# Patient Record
Sex: Female | Born: 1985 | Race: Black or African American | Hispanic: No | Marital: Married | Smoking: Never smoker
Health system: Southern US, Community
[De-identification: ages and names within clinical notes are randomized; demographics above are authoritative.]

## PROBLEM LIST (undated history)

## (undated) DIAGNOSIS — Z789 Other specified health status: Secondary | ICD-10-CM

## (undated) HISTORY — PX: NO PAST SURGERIES: SHX2092

---

## 2015-06-07 NOTE — L&D Delivery Note (Signed)
Delivery Note At 11:36 PM a viable female was delivered via Vaginal, Spontaneous Delivery (Presentation: LOA ).  APGAR: 7, 9; weight: pending.  Cord clamped and cut by CNM; hospital cord blood sample collected. Placenta status: spont , intact .  Cord:  3 vessel  Anesthesia:  None Episiotomy: None Lacerations: None Est. Blood Loss (mL):  100  Mom to postpartum.  Baby to Couplet care / Skin to Skin.  Cam HaiSHAW, Arien Morine CNM 02/18/2016, 12:03 AM

## 2015-07-23 LAB — OB RESULTS CONSOLE HEPATITIS B SURFACE ANTIGEN: Hepatitis B Surface Ag: NEGATIVE

## 2015-07-23 LAB — OB RESULTS CONSOLE VARICELLA ZOSTER ANTIBODY, IGG: Varicella: IMMUNE

## 2015-07-23 LAB — OB RESULTS CONSOLE HIV ANTIBODY (ROUTINE TESTING): HIV: NONREACTIVE

## 2015-10-15 LAB — OB RESULTS CONSOLE HGB/HCT, BLOOD
HEMATOCRIT: 35 %
Hemoglobin: 11.5 g/dL

## 2015-10-15 LAB — OB RESULTS CONSOLE RPR
RPR: NONREACTIVE
RPR: NONREACTIVE

## 2015-10-15 LAB — OB RESULTS CONSOLE GC/CHLAMYDIA
CHLAMYDIA, DNA PROBE: NEGATIVE
Chlamydia: NEGATIVE
GC PROBE AMP, GENITAL: NEGATIVE
Gonorrhea: NEGATIVE

## 2015-10-15 LAB — OB RESULTS CONSOLE ABO/RH: RH TYPE: POSITIVE

## 2015-10-15 LAB — OB RESULTS CONSOLE ANTIBODY SCREEN: ANTIBODY SCREEN: NEGATIVE

## 2015-10-15 LAB — OB RESULTS CONSOLE HIV ANTIBODY (ROUTINE TESTING)
HIV: NONREACTIVE
HIV: NONREACTIVE

## 2015-10-15 LAB — OB RESULTS CONSOLE PLATELET COUNT: PLATELETS: 143 10*3/uL

## 2015-10-16 ENCOUNTER — Other Ambulatory Visit (HOSPITAL_COMMUNITY): Payer: Self-pay | Admitting: Nurse Practitioner

## 2015-10-16 DIAGNOSIS — Z3689 Encounter for other specified antenatal screening: Secondary | ICD-10-CM

## 2015-10-23 ENCOUNTER — Encounter (HOSPITAL_COMMUNITY): Payer: Self-pay | Admitting: Nurse Practitioner

## 2015-10-28 ENCOUNTER — Encounter (HOSPITAL_COMMUNITY): Payer: Self-pay

## 2015-10-29 ENCOUNTER — Ambulatory Visit (HOSPITAL_COMMUNITY): Payer: Self-pay

## 2015-10-29 ENCOUNTER — Other Ambulatory Visit (HOSPITAL_COMMUNITY): Payer: Self-pay | Admitting: Nurse Practitioner

## 2015-10-29 ENCOUNTER — Encounter (HOSPITAL_COMMUNITY): Payer: Self-pay

## 2015-10-29 ENCOUNTER — Ambulatory Visit (HOSPITAL_COMMUNITY)
Admission: RE | Admit: 2015-10-29 | Discharge: 2015-10-29 | Disposition: A | Discharge status: Home / Self Care | Payer: Medicaid Other | Source: Ambulatory Visit | Attending: Nurse Practitioner | Admitting: Nurse Practitioner

## 2015-10-29 VITALS — BP 122/77 | HR 84 | Wt 143.2 lb

## 2015-10-29 DIAGNOSIS — O09292 Supervision of pregnancy with other poor reproductive or obstetric history, second trimester: Secondary | ICD-10-CM | POA: Insufficient documentation

## 2015-10-29 DIAGNOSIS — O09299 Supervision of pregnancy with other poor reproductive or obstetric history, unspecified trimester: Secondary | ICD-10-CM

## 2015-10-29 DIAGNOSIS — Z36 Encounter for antenatal screening of mother: Secondary | ICD-10-CM | POA: Diagnosis present

## 2015-10-29 DIAGNOSIS — Z3689 Encounter for other specified antenatal screening: Secondary | ICD-10-CM

## 2015-10-29 DIAGNOSIS — Z3A24 24 weeks gestation of pregnancy: Secondary | ICD-10-CM

## 2015-10-29 HISTORY — DX: Other specified health status: Z78.9

## 2015-10-30 ENCOUNTER — Other Ambulatory Visit (HOSPITAL_COMMUNITY): Payer: Self-pay

## 2015-11-05 ENCOUNTER — Ambulatory Visit (INDEPENDENT_AMBULATORY_CARE_PROVIDER_SITE_OTHER): Payer: Medicaid Other | Admitting: Obstetrics & Gynecology

## 2015-11-05 ENCOUNTER — Encounter: Payer: Self-pay | Admitting: Obstetrics & Gynecology

## 2015-11-05 VITALS — BP 114/7 | HR 84 | Wt 142.8 lb

## 2015-11-05 DIAGNOSIS — O09299 Supervision of pregnancy with other poor reproductive or obstetric history, unspecified trimester: Secondary | ICD-10-CM | POA: Insufficient documentation

## 2015-11-05 DIAGNOSIS — Z3482 Encounter for supervision of other normal pregnancy, second trimester: Secondary | ICD-10-CM

## 2015-11-05 DIAGNOSIS — O09292 Supervision of pregnancy with other poor reproductive or obstetric history, second trimester: Secondary | ICD-10-CM

## 2015-11-05 DIAGNOSIS — Z3490 Encounter for supervision of normal pregnancy, unspecified, unspecified trimester: Secondary | ICD-10-CM | POA: Insufficient documentation

## 2015-11-05 LAB — POCT URINALYSIS DIP (DEVICE)
BILIRUBIN URINE: NEGATIVE
Glucose, UA: NEGATIVE mg/dL
HGB URINE DIPSTICK: NEGATIVE
KETONES UR: NEGATIVE mg/dL
LEUKOCYTES UA: NEGATIVE
Nitrite: NEGATIVE
PH: 7 (ref 5.0–8.0)
Protein, ur: NEGATIVE mg/dL
Specific Gravity, Urine: 1.015 (ref 1.005–1.030)
Urobilinogen, UA: 0.2 mg/dL (ref 0.0–1.0)

## 2015-11-05 NOTE — Progress Notes (Signed)
   Subjective: referred for h/o SGA infants delivered in [place]    Tiffany Osborn is a [id number] 5823w3d being seen today for her first obstetrical visit.  Her obstetrical history is significant for h/o SGA infants. Patient does intend to breast feed. Pregnancy history fully reviewed.  Patient reports no complaints.  Filed Vitals:   11/05/15 0859  BP: 114/7  Pulse: 84  Weight: 142 lb 12.8 oz (64.774 kg)    HISTORY: OB History  Gravida Para Term Preterm AB SAB TAB Ectopic Multiple Living  7 6 6  0 0 0 0 0 1 7    # Outcome Date GA Lbr Len/2nd Weight Sex Delivery Anes PTL Lv  7 Current           6A Term 2015    M Vag-Spont   Y  6B Term 2015    M Vag-Spont   Y  5 Term 2014    F Vag-Spont   Y  4 Term 2012    F Vag-Spont   Y  3 Term 2009    F Vag-Spont   Y  2 Term 2007    M Vag-Spont   Y  1 Term 2004    M Vag-Spont   Y     Past Medical History  Diagnosis Date  . Medical history non-contributory    Past Surgical History  Procedure Laterality Date  . No past surgeries     History reviewed. No pertinent family history.   Exam    Uterus:  Fundal Height: 26 cm  Pelvic Exam:                               System: Breast:      Skin: normal coloration and turgor, no rashes    Neurologic: oriented, normal mood   Extremities: normal strength, tone, and muscle mass   HEENT PERRLA   Mouth/Teeth dental hygiene good   Neck supple   Cardiovascular: regular rate and rhythm   Respiratory:  appears well, vitals normal, no respiratory distress, acyanotic, normal RR   Abdomen: gravid c/w dates   Urinary:        Assessment:    Pregnancy: [id number] Patient Active Problem List   Diagnosis Date Noted  . Supervision of normal pregnancy, antepartum 11/05/2015  . Prior pregnancy complicated by SGA (small for gestational age), antepartum 11/05/2015        Plan:     Initial labs drawn. Prenatal vitamins. Problem list reviewed and updated. Genetic Screening discussed not done  late to care  Ultrasound discussed; fetal survey: results reviewed.  Follow up in 3 weeks. 50% of 30 min visit spent on counseling and coordination of care.  Discussed history, has had normal and low birth weight baby, last delivery was normal weight twins. Plan f/u US 32 weeks   Tiffany Osborn 11/05/2015

## 2015-11-05 NOTE — Patient Instructions (Signed)

## 2015-11-05 NOTE — Progress Notes (Signed)
Swahili interpreter [phone]

## 2015-11-06 ENCOUNTER — Encounter: Payer: Self-pay | Admitting: *Deleted

## 2015-11-06 DIAGNOSIS — Z3480 Encounter for supervision of other normal pregnancy, unspecified trimester: Secondary | ICD-10-CM

## 2015-11-06 LAB — CYTOLOGY - PAP: Pap: NEGATIVE

## 2015-11-26 ENCOUNTER — Ambulatory Visit (INDEPENDENT_AMBULATORY_CARE_PROVIDER_SITE_OTHER): Payer: Medicaid Other | Admitting: Family Medicine

## 2015-11-26 VITALS — BP 122/64 | HR 79 | Wt 146.8 lb

## 2015-11-26 DIAGNOSIS — Z3483 Encounter for supervision of other normal pregnancy, third trimester: Secondary | ICD-10-CM

## 2015-11-26 DIAGNOSIS — O09293 Supervision of pregnancy with other poor reproductive or obstetric history, third trimester: Secondary | ICD-10-CM

## 2015-11-26 DIAGNOSIS — Z23 Encounter for immunization: Secondary | ICD-10-CM

## 2015-11-26 DIAGNOSIS — O365930 Maternal care for other known or suspected poor fetal growth, third trimester, not applicable or unspecified: Secondary | ICD-10-CM

## 2015-11-26 LAB — POCT URINALYSIS DIP (DEVICE)
BILIRUBIN URINE: NEGATIVE
GLUCOSE, UA: NEGATIVE mg/dL
HGB URINE DIPSTICK: NEGATIVE
Ketones, ur: NEGATIVE mg/dL
LEUKOCYTES UA: NEGATIVE
NITRITE: NEGATIVE
Protein, ur: NEGATIVE mg/dL
Specific Gravity, Urine: 1.02 (ref 1.005–1.030)
UROBILINOGEN UA: 0.2 mg/dL (ref 0.0–1.0)
pH: 7 (ref 5.0–8.0)

## 2015-11-26 LAB — CBC
HCT: 33.8 % — ABNORMAL LOW (ref 35.0–45.0)
HEMOGLOBIN: 11.2 g/dL — AB (ref 11.7–15.5)
MCH: 27.9 pg (ref 27.0–33.0)
MCHC: 33.1 g/dL (ref 32.0–36.0)
MCV: 84.3 fL (ref 80.0–100.0)
MPV: 11.3 fL (ref 7.5–12.5)
Platelets: 145 10*3/uL (ref 140–400)
RBC: 4.01 MIL/uL (ref 3.80–5.10)
RDW: 13.1 % (ref 11.0–15.0)
WBC: 4.8 10*3/uL (ref 3.8–10.8)

## 2015-11-26 MED ORDER — TETANUS-DIPHTH-ACELL PERTUSSIS 5-2.5-18.5 LF-MCG/0.5 IM SUSP
0.5000 mL | Freq: Once | INTRAMUSCULAR | Status: AC
  Administered 2015-11-26: 0.5 mL via INTRAMUSCULAR

## 2015-11-26 NOTE — Progress Notes (Signed)
Subjective:  Tiffany Osborn is a 30 y.o. G7P6007 at [redacted]w[redacted]d being seen today for ongoing prenatal care.  She is currently monitored for the following issues for this low-risk pregnancy and has Supervision of normal pregnancy, antepartum and Prior pregnancy complicated by SGA (small for gestational age), antepartum on her problem list.  Patient reports no complaints.  Contractions: Not present. Vag. Bleeding: None.  Movement: Present. Denies leaking of fluid.   The following portions of the patient's history were reviewed and updated as appropriate: allergies, current medications, past family history, past medical history, past social history, past surgical history and problem list. Problem list updated.  Objective:   Filed Vitals:   11/26/15 1055  BP: 122/64  Pulse: 79  Weight: 146 lb 12.8 oz (66.588 kg)    Fetal Status: Fetal Heart Rate (bpm): 155 Fundal Height: 28 cm Movement: Present     General:  Alert, oriented and cooperative. Patient is in no acute distress.  Skin: Skin is warm and dry. No rash noted.   Cardiovascular: Normal heart rate noted  Respiratory: Normal respiratory effort, no problems with respiration noted  Abdomen: Soft, gravid, appropriate for gestational age. Pain/Pressure: Absent     Pelvic: Cervical exam deferred        Extremities: Normal range of motion.  Edema: None  Mental Status: Normal mood and affect. Normal behavior. Normal judgment and thought content.   Urinalysis: Urine Protein: Negative Urine Glucose: Negative  Assessment and Plan:  Pregnancy: G7P6007 at 320w3d  1. Prior pregnancy complicated by SGA (small for gestational age), antepartum, third trimester   2. Supervision of normal pregnancy, antepartum, third trimester - updated box - Tdap (BOOSTRIX) injection 0.5 mL; Inject 0.5 mLs into the muscle once. - CBC - HIV antibody (with reflex) - RPR - Glucose Tolerance, 1 HR (50g) w/o Fasting  Preterm labor symptoms and general obstetric precautions  including but not limited to vaginal bleeding, contractions, leaking of fluid and fetal movement were reviewed in detail with the patient. Please refer to After Visit Summary for other counseling recommendations.  Return in about 4 weeks (around 12/24/2015) for Routine prenatal care.   Federico FlakeKimberly Niles Saharah Sherrow, MD

## 2015-11-26 NOTE — Patient Instructions (Signed)

## 2015-11-26 NOTE — Progress Notes (Signed)
28 week labs/1hr gtt today TDap today

## 2015-11-27 LAB — RPR

## 2015-11-27 LAB — GLUCOSE TOLERANCE, 1 HOUR (50G) W/O FASTING: Glucose, 1 Hr, gestational: 94 mg/dL (ref <140)

## 2015-11-27 LAB — HIV ANTIBODY (ROUTINE TESTING W REFLEX): HIV 1&2 Ab, 4th Generation: NONREACTIVE

## 2015-12-10 ENCOUNTER — Ambulatory Visit (HOSPITAL_COMMUNITY): Admission: RE | Admit: 2015-12-10 | Payer: Medicaid Other | Source: Ambulatory Visit

## 2015-12-17 ENCOUNTER — Other Ambulatory Visit (HOSPITAL_COMMUNITY): Payer: Self-pay | Admitting: Maternal and Fetal Medicine

## 2015-12-17 ENCOUNTER — Encounter (HOSPITAL_COMMUNITY): Payer: Self-pay

## 2015-12-17 ENCOUNTER — Ambulatory Visit (HOSPITAL_COMMUNITY)
Admission: RE | Admit: 2015-12-17 | Discharge: 2015-12-17 | Disposition: A | Discharge status: Home / Self Care | Payer: Medicaid Other | Source: Ambulatory Visit | Attending: Maternal and Fetal Medicine | Admitting: Maternal and Fetal Medicine

## 2015-12-17 DIAGNOSIS — O09293 Supervision of pregnancy with other poor reproductive or obstetric history, third trimester: Secondary | ICD-10-CM | POA: Diagnosis present

## 2015-12-17 DIAGNOSIS — O365930 Maternal care for other known or suspected poor fetal growth, third trimester, not applicable or unspecified: Secondary | ICD-10-CM | POA: Diagnosis not present

## 2015-12-17 DIAGNOSIS — Z3A31 31 weeks gestation of pregnancy: Secondary | ICD-10-CM | POA: Diagnosis not present

## 2015-12-17 DIAGNOSIS — O09299 Supervision of pregnancy with other poor reproductive or obstetric history, unspecified trimester: Secondary | ICD-10-CM

## 2015-12-29 ENCOUNTER — Ambulatory Visit (INDEPENDENT_AMBULATORY_CARE_PROVIDER_SITE_OTHER): Payer: Medicaid Other | Admitting: Medical

## 2015-12-29 VITALS — BP 114/72 | HR 86 | Wt 150.0 lb

## 2015-12-29 DIAGNOSIS — R8290 Unspecified abnormal findings in urine: Secondary | ICD-10-CM

## 2015-12-29 DIAGNOSIS — Z3483 Encounter for supervision of other normal pregnancy, third trimester: Secondary | ICD-10-CM

## 2015-12-29 LAB — POCT URINALYSIS DIP (DEVICE)
BILIRUBIN URINE: NEGATIVE
Glucose, UA: NEGATIVE mg/dL
HGB URINE DIPSTICK: NEGATIVE
KETONES UR: NEGATIVE mg/dL
Leukocytes, UA: NEGATIVE
Nitrite: POSITIVE — AB
PH: 6.5 (ref 5.0–8.0)
PROTEIN: NEGATIVE mg/dL
Specific Gravity, Urine: 1.025 (ref 1.005–1.030)
Urobilinogen, UA: 1 mg/dL (ref 0.0–1.0)

## 2015-12-29 NOTE — Patient Instructions (Signed)
Fetal Movement Counts °Patient Name: __________________________________________________ Patient Due Date: ____________________ °Performing a fetal movement count is highly recommended in high-risk pregnancies, but it is good for every pregnant woman to do. Your health care provider may ask you to start counting fetal movements at 28 weeks of the pregnancy. Fetal movements often increase: °· After eating a full meal. °· After physical activity. °· After eating or drinking something sweet or cold. °· At rest. °Pay attention to when you feel the baby is most active. This will help you notice a pattern of your baby's sleep and wake cycles and what factors contribute to an increase in fetal movement. It is important to perform a fetal movement count at the same time each day when your baby is normally most active.  °HOW TO COUNT FETAL MOVEMENTS °1. Find a quiet and comfortable area to sit or lie down on your left side. Lying on your left side provides the best blood and oxygen circulation to your baby. °2. Write down the day and time on a sheet of paper or in a journal. °3. Start counting kicks, flutters, swishes, rolls, or jabs in a 2-hour period. You should feel at least 10 movements within 2 hours. °4. If you do not feel 10 movements in 2 hours, wait 2-3 hours and count again. Look for a change in the pattern or not enough counts in 2 hours. °SEEK MEDICAL CARE IF: °· You feel less than 10 counts in 2 hours, tried twice. °· There is no movement in over an hour. °· The pattern is changing or taking longer each day to reach 10 counts in 2 hours. °· You feel the baby is not moving as he or she usually does. °Date: ____________ Movements: ____________ Start time: ____________ Finish time: ____________  °Date: ____________ Movements: ____________ Start time: ____________ Finish time: ____________ °Date: ____________ Movements: ____________ Start time: ____________ Finish time: ____________ °Date: ____________ Movements:  ____________ Start time: ____________ Finish time: ____________ °Date: ____________ Movements: ____________ Start time: ____________ Finish time: ____________ °Date: ____________ Movements: ____________ Start time: ____________ Finish time: ____________ °Date: ____________ Movements: ____________ Start time: ____________ Finish time: ____________ °Date: ____________ Movements: ____________ Start time: ____________ Finish time: ____________  °Date: ____________ Movements: ____________ Start time: ____________ Finish time: ____________ °Date: ____________ Movements: ____________ Start time: ____________ Finish time: ____________ °Date: ____________ Movements: ____________ Start time: ____________ Finish time: ____________ °Date: ____________ Movements: ____________ Start time: ____________ Finish time: ____________ °Date: ____________ Movements: ____________ Start time: ____________ Finish time: ____________ °Date: ____________ Movements: ____________ Start time: ____________ Finish time: ____________ °Date: ____________ Movements: ____________ Start time: ____________ Finish time: ____________  °Date: ____________ Movements: ____________ Start time: ____________ Finish time: ____________ °Date: ____________ Movements: ____________ Start time: ____________ Finish time: ____________ °Date: ____________ Movements: ____________ Start time: ____________ Finish time: ____________ °Date: ____________ Movements: ____________ Start time: ____________ Finish time: ____________ °Date: ____________ Movements: ____________ Start time: ____________ Finish time: ____________ °Date: ____________ Movements: ____________ Start time: ____________ Finish time: ____________ °Date: ____________ Movements: ____________ Start time: ____________ Finish time: ____________  °Date: ____________ Movements: ____________ Start time: ____________ Finish time: ____________ °Date: ____________ Movements: ____________ Start time: ____________ Finish  time: ____________ °Date: ____________ Movements: ____________ Start time: ____________ Finish time: ____________ °Date: ____________ Movements: ____________ Start time: ____________ Finish time: ____________ °Date: ____________ Movements: ____________ Start time: ____________ Finish time: ____________ °Date: ____________ Movements: ____________ Start time: ____________ Finish time: ____________ °Date: ____________ Movements: ____________ Start time: ____________ Finish time: ____________  °Date: ____________ Movements: ____________ Start time: ____________ Finish   time: ____________ °Date: ____________ Movements: ____________ Start time: ____________ Finish time: ____________ °Date: ____________ Movements: ____________ Start time: ____________ Finish time: ____________ °Date: ____________ Movements: ____________ Start time: ____________ Finish time: ____________ °Date: ____________ Movements: ____________ Start time: ____________ Finish time: ____________ °Date: ____________ Movements: ____________ Start time: ____________ Finish time: ____________ °Date: ____________ Movements: ____________ Start time: ____________ Finish time: ____________  °Date: ____________ Movements: ____________ Start time: ____________ Finish time: ____________ °Date: ____________ Movements: ____________ Start time: ____________ Finish time: ____________ °Date: ____________ Movements: ____________ Start time: ____________ Finish time: ____________ °Date: ____________ Movements: ____________ Start time: ____________ Finish time: ____________ °Date: ____________ Movements: ____________ Start time: ____________ Finish time: ____________ °Date: ____________ Movements: ____________ Start time: ____________ Finish time: ____________ °Date: ____________ Movements: ____________ Start time: ____________ Finish time: ____________  °Date: ____________ Movements: ____________ Start time: ____________ Finish time: ____________ °Date: ____________  Movements: ____________ Start time: ____________ Finish time: ____________ °Date: ____________ Movements: ____________ Start time: ____________ Finish time: ____________ °Date: ____________ Movements: ____________ Start time: ____________ Finish time: ____________ °Date: ____________ Movements: ____________ Start time: ____________ Finish time: ____________ °Date: ____________ Movements: ____________ Start time: ____________ Finish time: ____________ °Date: ____________ Movements: ____________ Start time: ____________ Finish time: ____________  °Date: ____________ Movements: ____________ Start time: ____________ Finish time: ____________ °Date: ____________ Movements: ____________ Start time: ____________ Finish time: ____________ °Date: ____________ Movements: ____________ Start time: ____________ Finish time: ____________ °Date: ____________ Movements: ____________ Start time: ____________ Finish time: ____________ °Date: ____________ Movements: ____________ Start time: ____________ Finish time: ____________ °Date: ____________ Movements: ____________ Start time: ____________ Finish time: ____________ °  °This information is not intended to replace advice given to you by your health care provider. Make sure you discuss any questions you have with your health care provider. °  °Document Released: 06/22/2006 Document Revised: 06/13/2014 Document Reviewed: 03/19/2012 °Elsevier Interactive Patient Education ©2016 [employer] °Braxton Hicks Contractions °Contractions of the uterus can occur throughout pregnancy. Contractions are not always a sign that you are in labor.  °WHAT ARE BRAXTON HICKS CONTRACTIONS?  °Contractions that occur before labor are called Braxton Hicks contractions, or false labor. Toward the end of pregnancy (32-34 weeks), these contractions can develop more often and may become more forceful. This is not true labor because these contractions do not result in opening (dilatation) and thinning of  the cervix. They are sometimes difficult to tell apart from true labor because these contractions can be forceful and people have different pain tolerances. You should not feel embarrassed if you go to the hospital with false labor. Sometimes, the only way to tell if you are in true labor is for your health care provider to look for changes in the cervix. °If there are no prenatal problems or other health problems associated with the pregnancy, it is completely safe to be sent home with false labor and await the onset of true labor. °HOW CAN YOU TELL THE DIFFERENCE BETWEEN TRUE AND FALSE LABOR? °False Labor °· The contractions of false labor are usually shorter and not as hard as those of true labor.   °· The contractions are usually irregular.   °· The contractions are often felt in the front of the lower abdomen and in the groin.   °· The contractions may go away when you walk around or change positions while lying down.   °· The contractions get weaker and are shorter lasting as time goes on.   °· The contractions do not usually become progressively stronger, regular, and closer together as with true labor.   °True Labor °· Contractions in true   labor last 30-70 seconds, become very regular, usually become more intense, and increase in frequency.   °· The contractions do not go away with walking.   °· The discomfort is usually felt in the top of the uterus and spreads to the lower abdomen and low back.   °· True labor can be determined by your health care provider with an exam. This will show that the cervix is dilating and getting thinner.   °WHAT TO REMEMBER °· Keep up with your usual exercises and follow other instructions given by your health care provider.   °· Take medicines as directed by your health care provider.   °· Keep your regular prenatal appointments.   °· Eat and drink lightly if you think you are going into labor.   °· If Braxton Hicks contractions are making you uncomfortable:   °¨ Change your  position from lying down or resting to walking, or from walking to resting.   °¨ Sit and rest in a tub of warm water.   °¨ Drink 2-3 glasses of water. Dehydration may cause these contractions.   °¨ Do slow and deep breathing several times an hour.   °WHEN SHOULD I SEEK IMMEDIATE MEDICAL CARE? °Seek immediate medical care if: °· Your contractions become stronger, more regular, and closer together.   °· You have fluid leaking or gushing from your vagina.   °· You have a fever.   °· You pass blood-tinged mucus.   °· You have vaginal bleeding.   °· You have continuous abdominal pain.   °· You have low back pain that you never had before.   °· You feel your baby's head pushing down and causing pelvic pressure.   °· Your baby is not moving as much as it used to.   °  °This information is not intended to replace advice given to you by your health care provider. Make sure you discuss any questions you have with your health care provider. °  °Document Released: 05/23/2005 Document Revised: 05/28/2013 Document Reviewed: 03/04/2013 °Elsevier Interactive Patient Education ©2016 [employer] ° °

## 2015-12-29 NOTE — Progress Notes (Signed)
  Subjective:  Tiffany Osborn is a 30 y.o. [id number] at [redacted]w[redacted]d being seen today for ongoing prenatal care.  She is currently monitored for the following issues for this low-risk pregnancy and has Supervision of normal pregnancy, antepartum and Prior pregnancy complicated by SGA (small for gestational age), antepartum on her problem list.  Patient reports no complaints.  Contractions: Not present. Vag. Bleeding: None.  Movement: Present. Denies leaking of fluid.   The following portions of the patient's history were reviewed and updated as appropriate: allergies, current medications, past family history, past medical history, past social history, past surgical history and problem list. Problem list updated.  Objective:   Vitals:   12/29/15 1104  BP: 114/72  Pulse: 86  Weight: 150 lb (68 kg)    Fetal Status: Fetal Heart Rate (bpm): 150   Movement: Present     General:  Alert, oriented and cooperative. Patient is in no acute distress.  Skin: Skin is warm and dry. No rash noted.   Cardiovascular: Normal heart rate noted  Respiratory: Normal respiratory effort, no problems with respiration noted  Abdomen: Soft, gravid, appropriate for gestational age. Pain/Pressure: Absent     Pelvic:  Cervical exam deferred        Extremities: Normal range of motion.  Edema: None  Mental Status: Normal mood and affect. Normal behavior. Normal judgment and thought content.   Urinalysis: Urine Protein: Negative Urine Glucose: Negative  Assessment and Plan:  Pregnancy: [id number] at [redacted]w[redacted]d  1. Supervision of low risk pregnancy, third trimester - Urine + nitrites, neg leukocytes - urine culture sent, patient is asymptomatic, will await culture results  Preterm labor symptoms and general obstetric precautions including but not limited to vaginal bleeding, contractions, leaking of fluid and fetal movement were reviewed in detail with the patient. Please refer to After Visit Summary for other counseling  recommendations.  Return in about 2 weeks (around 01/12/2016) for LROB.   Marny Lowenstein, PA-C

## 2015-12-29 NOTE — Progress Notes (Signed)
Urine is +nitrites

## 2015-12-29 NOTE — Addendum Note (Signed)
Addended by: Garret Reddish on: 12/29/2015 11:52 AM   Modules accepted: Orders

## 2015-12-31 LAB — CULTURE, OB URINE
Colony Count: NO GROWTH
Organism ID, Bacteria: NO GROWTH

## 2016-01-19 ENCOUNTER — Encounter: Payer: Self-pay | Admitting: Family

## 2016-02-10 ENCOUNTER — Other Ambulatory Visit (HOSPITAL_COMMUNITY)
Admission: RE | Admit: 2016-02-10 | Discharge: 2016-02-10 | Disposition: A | Discharge status: Home / Self Care | Payer: Medicaid Other | Source: Ambulatory Visit | Attending: Family | Admitting: Family

## 2016-02-10 ENCOUNTER — Ambulatory Visit (INDEPENDENT_AMBULATORY_CARE_PROVIDER_SITE_OTHER): Payer: Medicaid Other | Admitting: Family

## 2016-02-10 ENCOUNTER — Encounter: Payer: Self-pay | Admitting: Family Medicine

## 2016-02-10 VITALS — BP 126/77 | HR 76 | Wt 156.0 lb

## 2016-02-10 DIAGNOSIS — Z23 Encounter for immunization: Secondary | ICD-10-CM | POA: Diagnosis not present

## 2016-02-10 DIAGNOSIS — Z113 Encounter for screening for infections with a predominantly sexual mode of transmission: Secondary | ICD-10-CM | POA: Diagnosis not present

## 2016-02-10 DIAGNOSIS — Z3483 Encounter for supervision of other normal pregnancy, third trimester: Secondary | ICD-10-CM

## 2016-02-10 DIAGNOSIS — Z3493 Encounter for supervision of normal pregnancy, unspecified, third trimester: Secondary | ICD-10-CM

## 2016-02-10 LAB — POCT URINALYSIS DIP (DEVICE)
BILIRUBIN URINE: NEGATIVE
GLUCOSE, UA: NEGATIVE mg/dL
HGB URINE DIPSTICK: NEGATIVE
Ketones, ur: NEGATIVE mg/dL
NITRITE: NEGATIVE
Protein, ur: NEGATIVE mg/dL
SPECIFIC GRAVITY, URINE: 1.015 (ref 1.005–1.030)
Urobilinogen, UA: 0.2 mg/dL (ref 0.0–1.0)
pH: 7.5 (ref 5.0–8.0)

## 2016-02-10 LAB — OB RESULTS CONSOLE GBS: GBS: NEGATIVE

## 2016-02-10 NOTE — Patient Instructions (Addendum)
AREA PEDIATRIC/FAMILY PRACTICE PHYSICIANS  [facility] [address], [address] [place], [place]  [address] Phone - [phone]   Fax - [phone]  [facility] [address] [place], [place] [address] Phone - [phone]   Fax - [phone]  [facility] AM[facility] [address] [place], [place]  [address] Phone - [phone]   Fax - [phone]  [facility] [address] [place], [place]  [address] Phone - [phone]   Fax - [phone]  [facility] [address] [place], [place]  [address] Phone - [phone]   Fax - [phone]  [facility] [address], [address] [place], [place]  [address] Phone - [phone]   Fax - [phone]  [facility] [address] [place], [place]  [address] Phone - [phone]   Fax - [phone]  [facility] [address], [address] [place], [place]  [address] Phone - [phone]   Fax - [phone]  [facility] [address] [place], [place]  [address] Phone - [phone]   Fax - [phone] [facility] [address] [place], [place]  [address] Phone - [phone]   Fax - [phone]  [facility] [address] [place], [place]  [address] Phone - [phone]   Fax - [phone]  [facility] [address] [place], [place]  [address] Phone - [phone]   Fax - [phone]  [facility] [address], [address] [place], [place]  [address] Phone - [phone]   Fax - [phone]  SHILPA GOSRANI [address] [place], [place]  [address] Phone - [phone]  [facility] [address] [place], [place]  [address] Phone - [phone]   Fax - [phone]  [facility] [address] [place], [place]  [address] Phone - [phone]   Fax - [phone]  [facility] [address] [place], [place]  [address] Phone - [phone]   Fax - [phone]  [facility] [address] [place], [place]  [address] Phone - [phone]   Fax - [phone]   [facility] [address] [place], [place]  [address] Phone - [phone]   Fax - [phone]  [facility] [address] [place], [place]  [address] Phone - [phone]   Fax - [phone]  DAVID RUBIN [address] [place], [place]  [address] Phone - [phone]   Fax - [phone]  [facility] [address] [place], [place]  [address] Phone - [phone]   Fax - [phone]  [facility] [address] , [place]  [address] Phone - [phone]   Fax - [phone] [facility] [address] [place], [place]  [address] Phone - [phone]   Fax - [phone]  [facility] [address] [place], [place]  [address] Phone - [phone]   Fax - [phone]  [facility] [address] [place], [place]  [address] Phone - [phone]   Fax - [phone]      Tercer trimestre de embarazo (Third Trimester of Pregnancy) El tercer trimestre comprende desde la semana29 hasta la semana42, es decir, desde el mes7 hasta el mes9. El tercer trimestre es   un perodo en el que el feto crece rpidamente. Hacia el final del noveno mes, el feto mide alrededor de 20pulgadas (45cm) de largo y pesa entre 6 y 10 libras (2,700 y 4,500kg).  CAMBIOS EN EL ORGANISMO Su organismo atraviesa por muchos cambios durante el embarazo, y estos varan de una mujer a otra.   Seguir aumentando de peso. Es de esperar que aumente entre 25 y 35libras (11 y 16kg) hacia el final del embarazo.  Podrn  aparecer las primeras estras en las caderas, el abdomen y las mamas.  Puede tener necesidad de orinar con ms frecuencia porque el feto baja hacia la pelvis y ejerce presin sobre la vejiga.  Debido al embarazo podr sentir acidez estomacal con frecuencia.  Puede estar estreida, ya que ciertas hormonas enlentecen los movimientos de los msculos que empujan los desechos a travs de los intestinos.  Pueden aparecer hemorroides o abultarse e hincharse las venas (venas varicosas).  Puede sentir dolor plvico debido al aumento de peso y a que las hormonas del embarazo relajan las articulaciones entre los huesos de la pelvis. El dolor de espalda puede ser consecuencia de la sobrecarga de los msculos que soportan la postura.  Tal vez haya cambios en el cabello que pueden incluir su engrosamiento, crecimiento rpido y cambios en la textura. Adems, a algunas mujeres se les cae el cabello durante o despus del embarazo, o tienen el cabello seco o fino. Lo ms probable es que el cabello se le normalice despus del nacimiento del beb.  Las mamas seguirn creciendo y le dolern. A veces, puede haber una secrecin amarilla de las mamas llamada calostro.  El ombligo puede salir hacia afuera.  Puede sentir que le falta el aire debido a que se expande el tero.  Puede notar que el feto "baja" o lo siente ms bajo, en el abdomen.  Puede tener una prdida de secrecin mucosa con sangre. Esto suele ocurrir en el trmino de unos pocos das a una semana antes de que comience el trabajo de parto.  El cuello del tero se vuelve delgado y blando (se borra) cerca de la fecha de parto. QU DEBE ESPERAR EN LOS EXMENES PRENATALES  Le harn exmenes prenatales cada 2semanas hasta la semana36. A partir de ese momento le harn exmenes semanales. Durante una visita prenatal de rutina:  La pesarn para asegurarse de que usted y el feto estn creciendo normalmente.  Le tomarn la presin arterial.  Le medirn  el abdomen para controlar el desarrollo del beb.  Se escucharn los latidos cardacos fetales.  Se evaluarn los resultados de los estudios solicitados en visitas anteriores.  Le revisarn el cuello del tero cuando est prxima la fecha de parto para controlar si este se ha borrado. Alrededor de la semana36, el mdico le revisar el cuello del tero. Al mismo tiempo, realizar un anlisis de las secreciones del tejido vaginal. Este examen es para determinar si hay un tipo de bacteria, estreptococo Grupo B. El mdico le explicar esto con ms detalle. El mdico puede preguntarle lo siguiente:  Cmo le gustara que fuera el parto.  Cmo se siente.  Si siente los movimientos del beb.  Si ha tenido sntomas anormales, como prdida de lquido, sangrado, dolores de cabeza intensos o clicos abdominales.  Si est consumiendo algn producto que contenga tabaco, como cigarrillos, tabaco de mascar y cigarrillos electrnicos.  Si tiene alguna pregunta. Otros exmenes o estudios de deteccin que pueden realizarse durante el tercer trimestre incluyen lo siguiente:  Anlisis de   sangre para controlar los niveles de hierro (anemia).  Controles fetales para determinar su salud, nivel de actividad y crecimiento. Si tiene alguna enfermedad o hay problemas durante el embarazo, le harn estudios.  Prueba del VIH (virus de inmunodeficiencia humana). Si corre un riesgo alto, pueden realizarle una prueba de deteccin del VIH durante el tercer trimestre del embarazo. FALSO TRABAJO DE PARTO Es posible que sienta contracciones leves e irregulares que finalmente desaparecen. Se llaman contracciones de Braxton Hicks o falso trabajo de parto. Las contracciones pueden durar horas, das o incluso semanas, antes de que el verdadero trabajo de parto se inicie. Si las contracciones ocurren a intervalos regulares, se intensifican o se hacen dolorosas, lo mejor es que la revise el mdico.  SIGNOS DE TRABAJO DE PARTO    Clicos de tipo menstrual.  Contracciones cada 5minutos o menos.  Contracciones que comienzan en la parte superior del tero y se extienden hacia abajo, a la zona inferior del abdomen y la espalda.  Sensacin de mayor presin en la pelvis o dolor de espalda.  Una secrecin de mucosidad acuosa o con sangre que sale de la vagina. Si tiene alguno de estos signos antes de la semana37 del embarazo, llame a su mdico de inmediato. Debe concurrir al hospital para que la controlen inmediatamente. INSTRUCCIONES PARA EL CUIDADO EN EL HOGAR   Evite fumar, consumir hierbas, beber alcohol y tomar frmacos que no le hayan recetado. Estas sustancias qumicas afectan la formacin y el desarrollo del beb.  No consuma ningn producto que contenga tabaco, lo que incluye cigarrillos, tabaco de mascar y cigarrillos electrnicos. Si necesita ayuda para dejar de fumar, consulte al mdico. Puede recibir asesoramiento y otro tipo de recursos para dejar de fumar.  Siga las indicaciones del mdico en relacin con el uso de medicamentos. Durante el embarazo, hay medicamentos que son seguros de tomar y otros que no.  Haga ejercicio solamente como se lo haya indicado el mdico. Sentir clicos uterinos es un buen signo para detener la actividad fsica.  Contine comiendo alimentos sanos con regularidad.  Use un sostn que le brinde buen soporte si le duelen las mamas.  No se d baos de inmersin en agua caliente, baos turcos ni saunas.  Use el cinturn de seguridad en todo momento mientras conduce.  No coma carne cruda ni queso sin cocinar; evite el contacto con las bandejas sanitarias de los gatos y la tierra que estos animales usan. Estos elementos contienen grmenes que pueden causar defectos congnitos en el beb.  Tome las vitaminas prenatales.  Tome entre 1500 y 2000mg de calcio diariamente comenzando en la semana20 del embarazo hasta el parto.  Si est estreida, pruebe un laxante suave (si el  mdico lo autoriza). Consuma ms alimentos ricos en fibra, como vegetales y frutas frescos y cereales integrales. Beba gran cantidad de lquido para mantener la orina de tono claro o color amarillo plido.  Dese baos de asiento con agua tibia para aliviar el dolor o las molestias causadas por las hemorroides. Use una crema para las hemorroides si el mdico la autoriza.  Si tiene venas varicosas, use medias de descanso. Eleve los pies durante 15minutos, 3 o 4veces por da. Limite el consumo de sal en su dieta.  Evite levantar objetos pesados, use zapatos de tacones bajos y mantenga una buena postura.  Descanse con las piernas elevadas si tiene calambres o dolor de cintura.  Visite a su dentista si no lo ha hecho durante el embarazo. Use un cepillo de dientes   blando para higienizarse los dientes y psese el hilo dental con suavidad.  Puede seguir manteniendo relaciones sexuales, a menos que el mdico le indique lo contrario.  No haga viajes largos excepto que sea absolutamente necesario y solo con la autorizacin del mdico.  Tome clases prenatales para entender, practicar y hacer preguntas sobre el trabajo de parto y el parto.  Haga un ensayo de la partida al hospital.  Prepare el bolso que llevar al hospital.  Prepare la habitacin del beb.  Concurra a todas las visitas prenatales segn las indicaciones de su mdico. SOLICITE ATENCIN MDICA SI:  No est segura de que est en trabajo de parto o de que ha roto la bolsa de las aguas.  Tiene mareos.  Siente clicos leves, presin en la pelvis o dolor persistente en el abdomen.  Tiene nuseas, vmitos o diarrea persistentes.  Observa una secrecin vaginal con mal olor.  Siente dolor al orinar. SOLICITE ATENCIN MDICA DE INMEDIATO SI:   Tiene fiebre.  Tiene una prdida de lquido por la vagina.  Tiene sangrado o pequeas prdidas vaginales.  Siente dolor intenso o clicos en el abdomen.  Sube o baja de peso  rpidamente.  Tiene dificultad para respirar y siente dolor de pecho.  Sbitamente se le hinchan mucho el rostro, las manos, los tobillos, los pies o las piernas.  No ha sentido los movimientos del beb durante una hora.  Siente un dolor de cabeza intenso que no se alivia con medicamentos.  Su visin se modifica.   Esta informacin no tiene como fin reemplazar el consejo del mdico. Asegrese de hacerle al mdico cualquier pregunta que tenga.   Document Released: 03/02/2005 Document Revised: 06/13/2014 Elsevier Interactive Patient Education 2016 Elsevier Inc.  

## 2016-02-10 NOTE — Addendum Note (Signed)
Addended by: Faythe CasaBELLAMY, Judah Chevere M on: 02/10/2016 11:14 AM   Modules accepted: Orders

## 2016-02-10 NOTE — Progress Notes (Signed)
   PRENATAL VISIT NOTE  Subjective:  [name] is a 30 y.o. [id number] at 7244w2d being seen today for ongoing prenatal care.  She is currently monitored for the following issues for this low-risk pregnancy and has Supervision of normal pregnancy, antepartum and Prior pregnancy complicated by SGA (small for gestational age), antepartum on her problem list.  Patient reports occasional contractions.  Contractions: Not present.  .  Movement: Present. Denies leaking of fluid.   The following portions of the patient's history were reviewed and updated as appropriate: allergies, current medications, past family history, past medical history, past social history, past surgical history and problem list. Problem list updated.  Objective:   Vitals:   02/10/16 0932  BP: 126/77  Pulse: 76  Weight: 156 lb (70.8 kg)    Fetal Status: Fetal Heart Rate (bpm): 153 Fundal Height: 39 cm Movement: Present  Presentation: Vertex  General:  Alert, oriented and cooperative. Patient is in no acute distress.  Skin: Skin is warm and dry. No rash noted.   Cardiovascular: Normal heart rate noted  Respiratory: Normal respiratory effort, no problems with respiration noted  Abdomen: Soft, gravid, appropriate for gestational age. Pain/Pressure: Absent     Pelvic:  Cervical exam deferred        Extremities: Normal range of motion.  Edema: Trace  Mental Status: Normal mood and affect. Normal behavior. Normal judgment and thought content.   Urinalysis: Urine Protein: Negative Urine Glucose: Negative  Assessment and Plan:  Pregnancy: [id number] at 3344w2d  1. Supervision of normal pregnancy in third trimester - Culture, beta strep (group b only) - GC/Chlamydia probe amp ([place] - Reviewed NST and IOL at 41 weeks - Reviewed fetal movement   Term labor symptoms and general obstetric precautions including but not limited to vaginal bleeding, contractions, leaking of fluid and fetal movement were reviewed in  detail with the patient. Please refer to After Visit Summary for other counseling recommendations.  Return in 1 week (on 02/17/2016) for appt and NST.  Eino FarberWalidah Kennith GainN Karim, CNM   All information obtained and convey via video interpreter

## 2016-02-11 LAB — GC/CHLAMYDIA PROBE AMP (~~LOC~~) NOT AT ARMC
CHLAMYDIA, DNA PROBE: NEGATIVE
NEISSERIA GONORRHEA: NEGATIVE

## 2016-02-12 LAB — CULTURE, BETA STREP (GROUP B ONLY)

## 2016-02-17 ENCOUNTER — Ambulatory Visit (INDEPENDENT_AMBULATORY_CARE_PROVIDER_SITE_OTHER): Payer: Medicaid Other | Admitting: Family

## 2016-02-17 ENCOUNTER — Encounter (HOSPITAL_COMMUNITY): Payer: Self-pay

## 2016-02-17 ENCOUNTER — Inpatient Hospital Stay (HOSPITAL_COMMUNITY)
Admission: AD | Admit: 2016-02-17 | Discharge: 2016-02-19 | DRG: 775 | Disposition: A | Discharge status: Home / Self Care | Payer: Medicaid Other | Source: Ambulatory Visit | Attending: Obstetrics and Gynecology | Admitting: Obstetrics and Gynecology

## 2016-02-17 VITALS — BP 117/67 | HR 76 | Wt 154.2 lb

## 2016-02-17 DIAGNOSIS — Z3483 Encounter for supervision of other normal pregnancy, third trimester: Secondary | ICD-10-CM

## 2016-02-17 DIAGNOSIS — Z36 Encounter for antenatal screening of mother: Secondary | ICD-10-CM | POA: Diagnosis not present

## 2016-02-17 DIAGNOSIS — Z3A40 40 weeks gestation of pregnancy: Secondary | ICD-10-CM

## 2016-02-17 DIAGNOSIS — O368390 Maternal care for abnormalities of the fetal heart rate or rhythm, unspecified trimester, not applicable or unspecified: Secondary | ICD-10-CM | POA: Diagnosis present

## 2016-02-17 DIAGNOSIS — O480 Post-term pregnancy: Principal | ICD-10-CM | POA: Diagnosis present

## 2016-02-17 DIAGNOSIS — Z3493 Encounter for supervision of normal pregnancy, unspecified, third trimester: Secondary | ICD-10-CM

## 2016-02-17 LAB — TYPE AND SCREEN
ABO/RH(D): O POS
ANTIBODY SCREEN: NEGATIVE

## 2016-02-17 LAB — POCT URINALYSIS DIP (DEVICE)
Bilirubin Urine: NEGATIVE
Glucose, UA: NEGATIVE mg/dL
HGB URINE DIPSTICK: NEGATIVE
Ketones, ur: NEGATIVE mg/dL
Leukocytes, UA: NEGATIVE
NITRITE: NEGATIVE
PH: 7 (ref 5.0–8.0)
PROTEIN: NEGATIVE mg/dL
Specific Gravity, Urine: 1.01 (ref 1.005–1.030)
UROBILINOGEN UA: 0.2 mg/dL (ref 0.0–1.0)

## 2016-02-17 LAB — CBC
HCT: 35.1 % — ABNORMAL LOW (ref 36.0–46.0)
Hemoglobin: 11.8 g/dL — ABNORMAL LOW (ref 12.0–15.0)
MCH: 26.4 pg (ref 26.0–34.0)
MCHC: 33.6 g/dL (ref 30.0–36.0)
MCV: 78.5 fL (ref 78.0–100.0)
PLATELETS: 154 10*3/uL (ref 150–400)
RBC: 4.47 MIL/uL (ref 3.87–5.11)
RDW: 14.5 % (ref 11.5–15.5)
WBC: 5.8 10*3/uL (ref 4.0–10.5)

## 2016-02-17 LAB — ABO/RH: ABO/RH(D): O POS

## 2016-02-17 MED ORDER — TERBUTALINE SULFATE 1 MG/ML IJ SOLN: 0.2500 mg | Freq: Once | INTRAMUSCULAR | Status: DC | PRN

## 2016-02-17 MED ORDER — ONDANSETRON HCL 4 MG/2ML IJ SOLN: 4.0000 mg | Freq: Four times a day (QID) | INTRAMUSCULAR | Status: DC | PRN

## 2016-02-17 MED ORDER — LIDOCAINE HCL (PF) 1 % IJ SOLN: 30.0000 mL | INTRAMUSCULAR | Status: DC | PRN

## 2016-02-17 MED ORDER — LACTATED RINGERS IV SOLN: 500.0000 mL | INTRAVENOUS | Status: DC | PRN

## 2016-02-17 MED ORDER — OXYTOCIN 40 UNITS IN LACTATED RINGERS INFUSION - SIMPLE MED
1.0000 m[IU]/min | INTRAVENOUS | Status: DC
  Administered 2016-02-17: 2 m[IU]/min via INTRAVENOUS

## 2016-02-17 MED ORDER — SOD CITRATE-CITRIC ACID 500-334 MG/5ML PO SOLN: 30.0000 mL | ORAL | Status: DC | PRN

## 2016-02-17 MED ORDER — OXYTOCIN 40 UNITS IN LACTATED RINGERS INFUSION - SIMPLE MED
2.5000 [IU]/h | INTRAVENOUS | Status: DC
  Filled 2016-02-17: qty 1000

## 2016-02-17 MED ORDER — FLEET ENEMA 7-19 GM/118ML RE ENEM: 1.0000 | ENEMA | RECTAL | Status: DC | PRN

## 2016-02-17 MED ORDER — OXYCODONE-ACETAMINOPHEN 5-325 MG PO TABS: 2.0000 | ORAL_TABLET | ORAL | Status: DC | PRN

## 2016-02-17 MED ORDER — LACTATED RINGERS IV SOLN
INTRAVENOUS | Status: DC
  Administered 2016-02-17 (×2): via INTRAVENOUS

## 2016-02-17 MED ORDER — OXYTOCIN BOLUS FROM INFUSION
500.0000 mL | Freq: Once | INTRAVENOUS | Status: AC
  Administered 2016-02-17: 500 mL via INTRAVENOUS

## 2016-02-17 MED ORDER — ACETAMINOPHEN 325 MG PO TABS: 650.0000 mg | ORAL_TABLET | ORAL | Status: DC | PRN

## 2016-02-17 MED ORDER — OXYCODONE-ACETAMINOPHEN 5-325 MG PO TABS: 1.0000 | ORAL_TABLET | ORAL | Status: DC | PRN

## 2016-02-17 NOTE — MAU Note (Signed)
Pt presents to MAU from the clinic for a NST. Pt denies ctx, bleeding or leaking of fluid.

## 2016-02-17 NOTE — Progress Notes (Signed)
Patient ID: Laural Benesdera Brannock, female   DOB: 04/04/1986, 30 y.o.   MRN: [id number] LABOR PROGRESS NOTE  Laural Benesdera Colquitt is a 30 y.o. [id number] at 5239w2d admitted for induction of labor after an approximately 2-3 minute deceleration was noted when she was seen in the office today for antenatal testing. Cervix was checked and she was found to be 3 cm and at that time she was sent to the MAU for evaluation and admission.  Subjective: She reports positive fetal movement and denies leakage of fluid or vaginal bleeding. She is having occasional contractions.   Objective: BP 125/81 (BP Location: Right Arm)   Pulse 74   Temp 98 F (36.7 C)   Resp 18   Ht 5\' 2"  (1.575 m)   Wt 69.9 kg (154 lb)   LMP 05/11/2015 (Exact Date)   BMI 28.17 kg/m  or  Vitals:   02/17/16 1203 02/17/16 1757  BP: 125/81   Pulse: 74   Resp: 18   Temp: 98 F (36.7 C)   Weight:  69.9 kg (154 lb)  Height:  5\' 2"  (1.575 m)       FHT: Baseline 135, moderate variability, no decelerations, positive accelerations Uterine activity: Occasional contractions  Labs: Lab Results  Component Value Date   WBC 5.8 02/17/2016   HGB 11.8 (L) 02/17/2016   HCT 35.1 (L) 02/17/2016   MCV 78.5 02/17/2016   PLT 154 02/17/2016    Patient Active Problem List   Diagnosis Date Noted  . Non-reassuring fetal cardiotocographic tracing 02/17/2016  . Supervision of normal pregnancy, antepartum 11/05/2015  . Prior pregnancy complicated by SGA (small for gestational age), antepartum 11/05/2015    Assessment / Plan: 30 y.o. [id number] at 539w2d here for induction of labor after decelerations were noted during antenatal testing today given her postdates pregnancy.  Labor: Now AROM to minimal clear, observe to see if active labor. Consider pitocin. Fetal Wellbeing: Category I Pain Control: IV pain medicine and can get epidural if desires Anticipated MOD:  Anticipating SVD  Franki CabotZachary L Janila Arrazola, Medical Student 9/13/20176:11 PM

## 2016-02-17 NOTE — Progress Notes (Signed)
Patient ID: Tiffany Benesdera Bieser, female   DOB: 11/11/1985, 30 y.o.   MRN: [id number] LABOR PROGRESS NOTE  Tiffany Osborn is a 30 y.o. [id number] at 8086w2d  admitted for IOL for NRFHT  Subjective: Doing well, comfortable. Amenable to AROM.   Objective: BP 125/81 (BP Location: Right Arm)   Pulse 74   Temp 98 F (36.7 C)   Resp 18   Ht 5\' 2"  (1.575 m)   Wt 69.9 kg (154 lb)   LMP 05/11/2015 (Exact Date)   BMI 28.17 kg/m  or  Vitals:   02/17/16 1203 02/17/16 1757  BP: 125/81   Pulse: 74   Resp: 18   Temp: 98 F (36.7 C)   Weight:  69.9 kg (154 lb)  Height:  5\' 2"  (1.575 m)     FHT: 130 baseline, moderate variability, +accelerations, no decelerations Uterine activity: irreg  Labs: Lab Results  Component Value Date   WBC 5.8 02/17/2016   HGB 11.8 (L) 02/17/2016   HCT 35.1 (L) 02/17/2016   MCV 78.5 02/17/2016   PLT 154 02/17/2016    Patient Active Problem List   Diagnosis Date Noted  . Non-reassuring fetal cardiotocographic tracing 02/17/2016  . Supervision of normal pregnancy, antepartum 11/05/2015  . Prior pregnancy complicated by SGA (small for gestational age), antepartum 11/05/2015    Assessment / Plan: 30 y.o. [id number] at 7786w2d here for IOL for NRFHT  Labor: now AROM to minimal clear. Since patient having possible ctx on toco will observe, if does not progress into active labor will start pitocin Fetal Wellbeing:  Category I Pain Control:  fentanyl Anticipated MOD:  SVD   Leland HerElsia J Quinlin Conant, DO PGY-1 9/13/20176:13 PM

## 2016-02-17 NOTE — Progress Notes (Signed)
[employer] interpreter # [phone] used for encounter. FHR - 135 per EFM cardio following cx exam   1138  Pt transported via w/c to MAU for admission

## 2016-02-17 NOTE — H&P (Signed)
LABOR AND DELIVERY ADMISSION HISTORY AND PHYSICAL NOTE  Tiffany Osborn is a 30 y.o. female [phone] with IUP at [redacted]w[redacted]d by 24 week ultrasound presenting for induction of labor. Patient was seen in the office today for antenatal testing given her postdates pregnancy. A approximately 2-3 minute deceleration was noted while she was on the monitor. She was having occasional contractions. Cervix was checked and she was found to be 3 cm and at that time she was sent to the MAU for evaluation and admission. In the MA U patient was comfortable and occasionally feeling some tightening in her abdomen. Her fetal heart tracing there showed occasional variable decelerations but otherwise good variability and reactivity. Patient will be admitted for induction.  She reports positive fetal movement. She denies leakage of fluid or vaginal bleeding.  Prenatal History/Complications:  Past Medical History: Past Medical History:  Diagnosis Date  . Medical history non-contributory     Past Surgical History: Past Surgical History:  Procedure Laterality Date  . NO PAST SURGERIES      Obstetrical History: OB History    Gravida Para Term Preterm AB Living   7 6 6  0 0 7   SAB TAB Ectopic Multiple Live Births   0 0 0 1 7      Social History: Social History   Social History  . Marital status: Married    Spouse name: N/A  . Number of children: N/A  . Years of education: N/A   Social History Main Topics  . Smoking status: Never Smoker  . Smokeless tobacco: Never Used  . Alcohol use No  . Drug use: No  . Sexual activity: Yes    Birth control/ protection: None   Other Topics Concern  . None   Social History Narrative  . None    Family History: History reviewed. No pertinent family history.  Allergies: No Known Allergies  Prescriptions Prior to Admission  Medication Sig Dispense Refill Last Dose  . Prenatal Vit-Fe Fumarate-FA (PRENATAL MULTIVITAMIN) TABS tablet Take 1 tablet by mouth daily at 12  noon.   Past Month at Unknown time     Review of Systems   All systems reviewed and negative except as stated in HPI  Blood pressure 125/81, pulse 74, temperature 98 F (36.7 C), resp. rate 18, last menstrual period 05/11/2015. General appearance: alert, cooperative and appears stated age Lungs: clear to auscultation bilaterally Heart: regular rate and rhythm Abdomen: soft, non-tender; bowel sounds normal Extremities: No calf swelling or tenderness Presentation: cephalic by nursing exam Fetal monitoring: Category 1 reactive Uterine activity: Irregular contractions approximately 5 minutes apart     Prenatal labs: ABO, Rh: --/--/O POS (09/13 1245) Antibody: NEG (09/13 1245) Rubella: !Error! RPR: NON REAC (06/22 1430)  HBsAg: Negative (02/16 0000)  HIV: NONREACTIVE (06/22 1430)  GBS:   negative 1 hr Glucola: Abnormal, normal 3 hour Anatomy US: Normal  Prenatal Transfer Tool  Maternal Diabetes: No Genetic Screening: Declined Maternal Ultrasounds/Referrals: Normal Fetal Ultrasounds or other Referrals:  None Maternal Substance Abuse:  No Significant Maternal Medications:  None Significant Maternal Lab Results: Lab values include: Group B Strep negative  Results for orders placed or performed during the hospital encounter of 02/17/16 (from the past 24 hour(s))  CBC   Collection Time: 02/17/16 12:45 PM  Result Value Ref Range   WBC 5.8 4.0 - 10.5 K/uL   RBC 4.47 3.87 - 5.11 MIL/uL   Hemoglobin 11.8 (L) 12.0 - 15.0 g/dL   HCT 45.4 (L) 09.8 -  46.0 %   MCV 78.5 78.0 - 100.0 fL   MCH 26.4 26.0 - 34.0 pg   MCHC 33.6 30.0 - 36.0 g/dL   RDW 16.114.5 09.611.5 - 04.515.5 %   Platelets 154 150 - 400 K/uL  Type and screen [facility]   Collection Time: 02/17/16 12:45 PM  Result Value Ref Range   ABO/RH(D) O POS    Antibody Screen NEG    Sample Expiration 02/20/2016   Results for orders placed or performed in visit on 02/17/16 (from the past 24 hour(s))  POCT  urinalysis dip (device)   Collection Time: 02/17/16 11:18 AM  Result Value Ref Range   Glucose, UA NEGATIVE NEGATIVE mg/dL   Bilirubin Urine NEGATIVE NEGATIVE   Ketones, ur NEGATIVE NEGATIVE mg/dL   Specific Gravity, Urine 1.010 1.005 - 1.030   Hgb urine dipstick NEGATIVE NEGATIVE   pH 7.0 5.0 - 8.0   Protein, ur NEGATIVE NEGATIVE mg/dL   Urobilinogen, UA 0.2 0.0 - 1.0 mg/dL   Nitrite NEGATIVE NEGATIVE   Leukocytes, UA NEGATIVE NEGATIVE    Patient Active Problem List   Diagnosis Date Noted  . Supervision of normal pregnancy, antepartum 11/05/2015  . Prior pregnancy complicated by SGA (small for gestational age), antepartum 11/05/2015    Assessment: [name] is a 30 y.o. [id number] at 5166w2d here for Induction of labor for nonreassuring fetal heart tones on antenatal testing.  #Labor: Admit to labor and delivery. We'll start low-dose Pitocin on admission. Wish to avoid Cytotec as patient has had some nonreassuring fetal heart tones and wished to have control over contractions. If patient starts to have recurrent variables consider rupture of membranes and IUPC placement #Pain: Patient will have IV pain medicine ordered and can get epidural if desires #FWB: Category to monitor closely for variables #ID:  GBS negative #MOF: Breast feeding #MOC: Undecided #Circ:  Considering outpatient but does not want inpatient  Tiffany Osborn 02/17/2016, 3:25 PM

## 2016-02-17 NOTE — Progress Notes (Signed)
   PRENATAL VISIT NOTE  Subjective:  Tiffany Osborn is a 30 y.o. [id number] at 1618w2d being seen today for ongoing prenatal care.  She is currently monitored for the following issues for this low-risk pregnancy and has Supervision of normal pregnancy, antepartum and Prior pregnancy complicated by SGA (small for gestational age), antepartum on her problem list.  Patient reports no complaints.  Contractions: Not present. Vag. Bleeding: None.  Movement: Present. Denies leaking of fluid.   The following portions of the patient's history were reviewed and updated as appropriate: allergies, current medications, past family history, past medical history, past social history, past surgical history and problem list. Problem list updated.  Objective:   Vitals:   02/17/16 1043  BP: 117/67  Pulse: 76  Weight: 154 lb 3.2 oz (69.9 kg)    Fetal Status: Fetal Heart Rate (bpm): NST-R Fundal Height: 39 cm Movement: Present  Presentation: Vertex FHR 135; prolonged decel for approx 4 min 60-80, moderate variability; AFI 6.5  General:  Alert, oriented and cooperative. Patient is in no acute distress.  Skin: Skin is warm and dry. No rash noted.   Cardiovascular: Normal heart rate noted  Respiratory: Normal respiratory effort, no problems with respiration noted  Abdomen: Soft, gravid, appropriate for gestational age. Pain/Pressure: Absent     Pelvic:  Cervical exam performed Dilation: 2.5 Effacement (%): 50 Station: -3  Extremities: Normal range of motion.  Edema: None  Mental Status: Normal mood and affect. Normal behavior. Normal judgment and thought content.   Urinalysis:    Protein negative Glucose neg  Assessment and Plan:  Pregnancy: [id number] at 7318w2d  1. Supervision of normal pregnancy in third trimester - Normal prenatal care course  2. Post term pregnancy, antepartum condition or complication - Amniotic fluid index with NST > reactive; prolonged deceleration 60-80's, approx 4 min - AFI 6.5 -  Consulted with Dr. Alvester MorinNewton [facility] - [facility] notified, no bed available at this time > to MAU for monitoring until bed available - Dr. Artist PaisYoo notified and given report  Marlis EdelsonWalidah N Karim, CNM

## 2016-02-17 NOTE — Progress Notes (Signed)
Patient ID: Tiffany Osborn, female   DOB: 09/12/1985, 30 y.o.   MRN: [id number]  Breathing well w/ ctx VSS, afeb FHR 150s, +accels, occ mi variables (tracing maternal intermittently) Ctx q 2-4 mins w/ Pit @ 132mu/min Cx 7/80/-1 by RN  IUP@term  Active labor  Anticipate SVD  Cam HaiSHAW, KIMBERLY CNM 02/17/2016 9:29 PM

## 2016-02-18 ENCOUNTER — Encounter (HOSPITAL_COMMUNITY): Payer: Self-pay

## 2016-02-18 DIAGNOSIS — Z3A40 40 weeks gestation of pregnancy: Secondary | ICD-10-CM

## 2016-02-18 LAB — CBC
HCT: 31.5 % — ABNORMAL LOW (ref 36.0–46.0)
HEMOGLOBIN: 10.5 g/dL — AB (ref 12.0–15.0)
MCH: 25.7 pg — ABNORMAL LOW (ref 26.0–34.0)
MCHC: 33.3 g/dL (ref 30.0–36.0)
MCV: 77.2 fL — ABNORMAL LOW (ref 78.0–100.0)
Platelets: 166 10*3/uL (ref 150–400)
RBC: 4.08 MIL/uL (ref 3.87–5.11)
RDW: 14.3 % (ref 11.5–15.5)
WBC: 13.3 10*3/uL — AB (ref 4.0–10.5)

## 2016-02-18 LAB — RPR: RPR: NONREACTIVE

## 2016-02-18 MED ORDER — DIPHENHYDRAMINE HCL 25 MG PO CAPS
25.0000 mg | ORAL_CAPSULE | Freq: Four times a day (QID) | ORAL | Status: DC | PRN
Start: 2016-02-18 — End: 2016-02-19

## 2016-02-18 MED ORDER — TETANUS-DIPHTH-ACELL PERTUSSIS 5-2.5-18.5 LF-MCG/0.5 IM SUSP
0.5000 mL | Freq: Once | INTRAMUSCULAR | Status: DC
  Filled 2016-02-18: qty 0.5

## 2016-02-18 MED ORDER — NYSTATIN 100000 UNIT/GM EX POWD
Freq: Three times a day (TID) | CUTANEOUS | Status: DC
  Administered 2016-02-18 – 2016-02-19 (×2): via TOPICAL
  Filled 2016-02-18: qty 15

## 2016-02-18 MED ORDER — ONDANSETRON HCL 4 MG PO TABS: 4.0000 mg | ORAL_TABLET | ORAL | Status: DC | PRN

## 2016-02-18 MED ORDER — COCONUT OIL OIL
1.0000 "application " | TOPICAL_OIL | Status: DC | PRN
  Filled 2016-02-18: qty 120

## 2016-02-18 MED ORDER — ACETAMINOPHEN 325 MG PO TABS
650.0000 mg | ORAL_TABLET | ORAL | Status: DC | PRN
  Administered 2016-02-18: 650 mg via ORAL
  Filled 2016-02-18: qty 2

## 2016-02-18 MED ORDER — WITCH HAZEL-GLYCERIN EX PADS: 1.0000 "application " | MEDICATED_PAD | CUTANEOUS | Status: DC | PRN

## 2016-02-18 MED ORDER — IBUPROFEN 600 MG PO TABS
600.0000 mg | ORAL_TABLET | Freq: Four times a day (QID) | ORAL | Status: DC
  Administered 2016-02-18 – 2016-02-19 (×6): 600 mg via ORAL
  Filled 2016-02-18 (×6): qty 1

## 2016-02-18 MED ORDER — BENZOCAINE-MENTHOL 20-0.5 % EX AERO
1.0000 "application " | INHALATION_SPRAY | CUTANEOUS | Status: DC | PRN
  Filled 2016-02-18: qty 56

## 2016-02-18 MED ORDER — SIMETHICONE 80 MG PO CHEW
80.0000 mg | CHEWABLE_TABLET | ORAL | Status: DC | PRN
Start: 2016-02-18 — End: 2016-02-19

## 2016-02-18 MED ORDER — TRAMADOL HCL 50 MG PO TABS: 50.0000 mg | ORAL_TABLET | Freq: Four times a day (QID) | ORAL | Status: DC | PRN

## 2016-02-18 MED ORDER — DIBUCAINE 1 % RE OINT
1.0000 "application " | TOPICAL_OINTMENT | RECTAL | Status: DC | PRN
  Filled 2016-02-18: qty 28

## 2016-02-18 MED ORDER — ZOLPIDEM TARTRATE 5 MG PO TABS
5.0000 mg | ORAL_TABLET | Freq: Every evening | ORAL | Status: DC | PRN
Start: 2016-02-18 — End: 2016-02-19

## 2016-02-18 MED ORDER — PRENATAL MULTIVITAMIN CH
1.0000 | ORAL_TABLET | Freq: Every day | ORAL | Status: DC
  Administered 2016-02-18 – 2016-02-19 (×2): 1 via ORAL
  Filled 2016-02-18 (×2): qty 1

## 2016-02-18 MED ORDER — ONDANSETRON HCL 4 MG/2ML IJ SOLN
4.0000 mg | INTRAMUSCULAR | Status: DC | PRN
Start: 2016-02-18 — End: 2016-02-19

## 2016-02-18 MED ORDER — SENNOSIDES-DOCUSATE SODIUM 8.6-50 MG PO TABS
2.0000 | ORAL_TABLET | ORAL | Status: DC
  Administered 2016-02-19: 2 via ORAL
  Filled 2016-02-18: qty 2

## 2016-02-18 NOTE — Progress Notes (Signed)
POSTPARTUM PROGRESS NOTE  Post Partum Day 1 Subjective:  Tiffany Benesdera Fantroy is a 30 y.o. [id number] 7566w2d s/p SVD.  No acute events overnight.  Pt denies problems with ambulating, voiding or po intake.  She denies nausea or vomiting.  Pain is moderately controlled.  She has had flatus. She has not had bowel movement.  Lochia Moderate.   Objective: Blood pressure 121/70, pulse 82, temperature 98.2 F (36.8 C), temperature source Oral, resp. rate 18, height 5\' 2"  (1.575 m), weight 154 lb (69.9 kg), last menstrual period 05/11/2015, unknown if currently breastfeeding.  Physical Exam:  General: alert, cooperative and no distress Lochia:normal flow Chest: no respiratory distress Heart:regular rate, distal pulses intact Abdomen: soft, nontender,  Uterine Fundus: firm, appropriately tender DVT Evaluation: No calf swelling or tenderness Extremities: trace edema   Recent Labs  02/17/16 1245 02/18/16 0508  HGB 11.8* 10.5*  HCT 35.1* 31.5*    Assessment/Plan:  ASSESSMENT: Tiffany Osborn is a 30 y.o. [id number] 5866w2d s/p SVD  Plan for discharge tomorrow and Breastfeeding   LOS: 1 day   Les Pouicholas SchenkMD 02/18/2016, 8:58 AM

## 2016-02-18 NOTE — Progress Notes (Signed)
Patient ID: Tiffany Osborn, female   DOB: 07/23/1985, 30 y.o.   MRN: [id number] POSTPARTUM PROGRESS NOTE  Post Partum Day 1 Subjective:  Tiffany Osborn is a 30 y.o. [id number] 7186w2d s/p SVD.  No acute events overnight.  Pt denies problems with ambulating, voiding or po intake.  Pain is well controlled.  She has had flatus. She has had bowel movement.  Lochia moderate.   She has decided to breast feed her baby. She wishes to have long term birth control and was interested in an IUD. Wishes to have her baby boy circumcised.   Objective: Blood pressure 121/70, pulse 82, temperature 98.2 F (36.8 C), temperature source Oral, resp. rate 18, height 5\' 2"  (1.575 m), weight 69.9 kg (154 lb), last menstrual period 05/11/2015, unknown if currently breastfeeding.  Physical Exam:  General: alert, cooperative and no distress   Recent Labs  02/17/16 1245 02/18/16 0508  HGB 11.8* 10.5*  HCT 35.1* 31.5*    Assessment/Plan:  ASSESSMENT: Tiffany Osborn is a 30 y.o. [id number] 8586w2d s/p SVD. She is doing well.    Plan for discharge tomorrow  Schedule her for 6 week follow up for IUD placement Consent her for circumcision today   LOS: 1 day   Harvin Hazelhristina Rizk, Medical Student 02/18/2016, 7:58 AM   OB FELLOW MEDICAL STUDENT NOTE ATTESTATION  I have seen and examined this patient. Note this is a Psychologist, occupationalmedical student note and as such does not necessarily reflect the patient's plan of care. Please see Ernestina Pennaicholas Radiah Lubinski MD's note for this date of service.    Ernestina Pennaicholas Calyn Rubi 02/18/2016, 1:59 PM

## 2016-02-18 NOTE — Progress Notes (Signed)
UR chart review completed.  

## 2016-02-18 NOTE — Lactation Note (Signed)
This note was copied from a baby's chart. Lactation Consultation Note: [facility] my visit. Mom reports baby just finished feeding for 30 min. Reports baby is latching well with no pain. States she knows how to hand express. No questions at present. To call for assist prn.   Patient Name: Boy Laural Benesdera Kovarik [id number]'s Date: 02/18/2016 Reason for consult: Initial assessment   Maternal Data Formula Feeding for Exclusion: No Has patient been taught Hand Expression?: Yes Does the patient have breastfeeding experience prior to this delivery?: Yes  Feeding Feeding Type: Breast Fed Length of feed: 30 min  LATCH Score/Interventions                      Lactation Tools Discussed/Used     Consult Status Consult Status: PRN    Pamelia HoitWeeks, Lakia Gritton D 02/18/2016, 2:34 PM

## 2016-02-19 LAB — RUBELLA SCREEN: RUBELLA: 15.3 {index} (ref >0.99)

## 2016-02-19 MED ORDER — IBUPROFEN 600 MG PO TABS: 600.0000 mg | ORAL_TABLET | Freq: Four times a day (QID) | ORAL | 0 refills | Status: AC | PRN

## 2016-02-19 NOTE — Progress Notes (Signed)
Patient ID: Laural Benesdera Agramonte, female   DOB: 10/22/1985, 30 y.o.   MRN: [id number] Post Partum Day 2  Subjective:  Laural Benesdera Greenwood is a 30 y.o. [id number] 276w2d s/p SVD.  No acute events overnight.  Pt denies problems with ambulating, voiding or po intake.  Pain is well controlled.  She has had flatus. She has not had bowel movement.  Lochia small.  Plan for birth control is IUD.  Method of Feeding: breastfeeding, going well  Objective: BP 116/71 (BP Location: Right Arm)   Pulse 71   Temp 97.6 F (36.4 C) (Oral)   Resp 16   Ht 5\' 2"  (1.575 m)   Wt 69.9 kg (154 lb)   LMP 05/11/2015 (Exact Date)   Breastfeeding? Unknown   BMI 28.17 kg/m   Physical Exam:  General: alert, cooperative and no distress Chest: CTAB Heart: RRR DVT Evaluation: No evidence of DVT seen on physical exam. Extremities: no pitting edema noted on lower extremities   Recent Labs  02/17/16 1245 02/18/16 0508  HGB 11.8* 10.5*  HCT 35.1* 31.5*    Assessment/Plan:  ASSESSMENT: Laural Benesdera Wiedemann is a 30 y.o. [id number] 486w2d ppd 2 s/p NSVD and is doing well.   Discharge home   Have her follow up in the hospital's clinic in 6 weeks for post partum visit and IUD placement   LOS: 2 days   Harvin HazelChristina Rizk 02/19/2016, 7:34 AM   I have seen and examined this patient and I agree with the above. See my Discharge Summary. Cam HaiSHAW, KIMBERLY CNM 9:12 AM 02/19/2016

## 2016-02-19 NOTE — Lactation Note (Signed)
This note was copied from a baby's chart. Lactation Consultation Note: I use video interperter for all teaching; mother has infant in cradle hold latched on when I entered the room. Mother advised to roll infant tummy to tummy so that infant could get more of the nipple in his mouth.  Mother ask is she could breastfeed her infant anywhere in public. Mother reasurred that she could breast feed anywhere. A breast feeding cover like a blanket would be a good idea. Mother denies any more questions. Advised in treatment of engorgement . Advised mother to breastfeeding 8-12 times in 24 hours. Mother receptive to all teaching.   Patient Name: Tiffany Laural Benesdera Dentinger BMWUX'LToday's Date: 02/19/2016 Reason for consult: Follow-up assessment   Maternal Data    Feeding Feeding Type: Breast Fed  LATCH Score/Interventions Latch: Grasps breast easily, tongue down, lips flanged, rhythmical sucking.  Audible Swallowing: Spontaneous and intermittent  Type of Nipple: Everted at rest and after stimulation  Comfort (Breast/Nipple): Soft / non-tender     Hold (Positioning): Assistance needed to correctly position infant at breast and maintain latch. Intervention(s): Support Pillows;Position options  LATCH Score: 9  Lactation Tools Discussed/Used     Consult Status      Alfred LevinsGranger, [name] 02/19/2016, 12:48 PM

## 2016-02-19 NOTE — Discharge Instructions (Signed)

## 2016-02-19 NOTE — Discharge Summary (Signed)
OB Discharge Summary     Patient Name: Tiffany Osborn DOB: 02/27/1986 MRN: [id number]  Date of admiLaural Benesssion: 02/17/2016 Delivering MD: Cam HaiSHAW, Adileny Delon D   Date of discharge: 02/19/2016  Admitting diagnosis: DCELLS Intrauterine pregnancy: 5895w2d     Secondary diagnosis:  Active Problems:   Non-reassuring fetal cardiotocographic tracing  Additional problems: grandmultip     Discharge diagnosis: Term Pregnancy Delivered                                                                                                Post partum procedures:none  Augmentation: AROM and Pitocin  Complications: None  Hospital course:  Induction of Labor With Vaginal Delivery   30 y.o. yo [id number] at 3695w2d was admitted to the hospital 02/17/2016 for induction of labor.  Indication for induction: NRFHR.  Patient had an uncomplicated labor course as follows: Membrane Rupture Time/Date: 6:03 PM ,02/17/2016   Intrapartum Procedures: Episiotomy: None [1]                                         Lacerations:  None [1]  Patient had delivery of a Viable infant.  Information for the patient's newborn:  Jorene Guestaomi, Boy Marleigh [[id number]][[id number]]  Delivery Method: Vaginal, Spontaneous Delivery (Filed from Delivery Summary)   02/17/2016  Details of delivery can be found in separate delivery note.  Patient had a routine postpartum course. Patient is discharged home 02/19/16.   Physical exam Vitals:   02/18/16 0300 02/18/16 0647 02/18/16 1044 02/19/16 0607  BP: 128/70 121/70 117/67 116/71  Pulse: 82  78 71  Resp: 20 18 18 16   Temp: 98 F (36.7 C) 98.2 F (36.8 C) 98.2 F (36.8 C) 97.6 F (36.4 C)  TempSrc: Oral Oral Oral Oral  Weight:      Height:       General: alert and cooperative Lochia: appropriate Uterine Fundus: firm Incision: N/A DVT Evaluation: No evidence of DVT seen on physical exam. Labs: Lab Results  Component Value Date   WBC 13.3 (H) 02/18/2016   HGB 10.5 (L) 02/18/2016   HCT 31.5 (L) 02/18/2016   MCV 77.2  (L) 02/18/2016   PLT 166 02/18/2016   No flowsheet data found.  Discharge instruction: per After Visit Summary and "Baby and Me Booklet".  After visit meds:    Medication List    TAKE these medications   ibuprofen 600 MG tablet Commonly known as:  ADVIL,MOTRIN Take 1 tablet (600 mg total) by mouth every 6 (six) hours as needed.   prenatal multivitamin Tabs tablet Take 1 tablet by mouth daily at 12 noon.       Diet: routine diet  Activity: Advance as tolerated. Pelvic rest for 6 weeks.   Outpatient follow up:6 weeks Follow up Appt:No future appointments. Follow up Visit:No Follow-up on file.  Postpartum contraception: IUD Mirena  Newborn Data: Live born female  Birth Weight: 6 lb 11.9 oz (3060 g) APGAR: 7, 9  Baby Feeding: Breast Disposition:home with mother   02/19/2016 Tristan Proto,  Jhovani Griswold, CNM  9:11 AM

## 2016-02-19 NOTE — Progress Notes (Signed)
With the assistance of [employer], CSW assessed MOB for transportation needs. MOB informed CSW that MOB's husband was in route to return to the hospital with car seat. MOB reported that family did not have any additional funds for transportation home.  CSW had MOB to verify address, and it was [place].  CSW explored MOB's thoughts and feelings about riding the bus home from the hospital and MOB reported that MOB felt comfortable. MOB provided MOB with 2 one-ride bus passes for MOB and FOB.  MOB reported the bus stops in front of MOB's home.    CSW also requested a Bundle Pack for MOB's infant.  MOB did not have any other needs or concerns at this time.  Blaine HamperAngel Osborn, MSW, LCSW Clinical Social Work [phone]

## 2016-02-24 ENCOUNTER — Encounter: Payer: Self-pay | Admitting: Family Medicine

## 2016-03-29 ENCOUNTER — Ambulatory Visit (INDEPENDENT_AMBULATORY_CARE_PROVIDER_SITE_OTHER): Payer: Medicaid Other | Admitting: Family

## 2016-03-29 VITALS — BP 130/86 | HR 70 | Wt 148.1 lb

## 2016-03-29 DIAGNOSIS — Z30017 Encounter for initial prescription of implantable subdermal contraceptive: Secondary | ICD-10-CM | POA: Diagnosis not present

## 2016-03-29 LAB — POCT PREGNANCY, URINE: Preg Test, Ur: NEGATIVE

## 2016-03-29 MED ORDER — ETONOGESTREL 68 MG ~~LOC~~ IMPL
68.0000 mg | DRUG_IMPLANT | Freq: Once | SUBCUTANEOUS | Status: AC
  Administered 2016-03-29: 68 mg via SUBCUTANEOUS

## 2016-03-29 NOTE — Progress Notes (Signed)
Subjective:     [name] is a 30 y.o. female who presents for a postpartum visit. She is 6 weeks postpartum following a spontaneous vaginal delivery. I have fully reviewed the prenatal and intrapartum course. The delivery was at 684w2d  gestational weeks. Outcome: spontaneous vaginal delivery. Anesthesia: none. Postpartum course has been unremarkable. Baby's course has been unremarkable. Baby is feeding by breast. Bleeding no bleeding. Bowel function is normal. Bladder function is normal. Patient is not sexually active. Contraception method is none.  Pt desires long-term birth control, husband desires for her to have more children.  Desires a method that is not detectable by partner.   Postpartum depression screening: negative.  The following portions of the patient's history were reviewed and updated as appropriate: allergies, current medications, past family history, past medical history, past social history, past surgical history and problem list.  Review of Systems Pertinent items are noted in HPI.   Objective:    BP 130/86   Pulse 70   Wt 148 lb 1.6 oz (67.2 kg)   Breastfeeding? Yes   BMI 27.09 kg/m         General:  alert, cooperative and appears stated age   Breasts:  inspection negative, no nipple discharge or bleeding, no masses or nodularity palpable  Lungs: clear to auscultation bilaterally  Heart:  regular rate and rhythm, S1, S2 normal, no murmur, click, rub or gallop  Abdomen: soft, non-tender; bowel sounds normal; no masses,  no organomegaly  Pelvic exam not indicated - not bleeding, no pain at vaginal area  NEXPLANON INSERTION: Appropriate time out taken. Nexlanon site (left arm) identified and thea area was prepped in usual sterile fashon. 2 cc of 1% lidocaine was used to anesthetize the area starting with the distal end.   Next, the area was cleansed with betadine and the Nexplanon was inserted without difficulty.  Pressure bandage was applied.  Pt was instructed to  remove pressure bandage in a few hours, and keep insertion site covered with a bandaid for 3 days. Assessment:     Normal postpartum exam. Pap smear not done at today's visit.    Nexplanon Insertion  Plan:    1. Contraception: Nexplanon 2. Use back-up method x 2 weeks 3.  Follow-up as needed  All information obtained and given by interpreter present in the room.    Eino FarberWalidah Kennith GainN Karim, CNM

## 2019-05-08 ENCOUNTER — Telehealth: Payer: Self-pay

## 2019-05-08 NOTE — Telephone Encounter (Signed)
I have called [facility] [place] [facility] to Schedule Birth Control  appointment for Ms Tiffany Osborn. Same Scheduled for December 23rd at 3:55pm. I have informed Ms Latasha of hte appointment date and time. Honor Loh RN BSN PCCN [phone]

## 2019-05-29 ENCOUNTER — Encounter: Payer: Self-pay | Admitting: Medical

## 2019-06-12 ENCOUNTER — Telehealth: Payer: Self-pay

## 2019-06-12 NOTE — Telephone Encounter (Signed)
I have called [facility] and scheduled appointment for Monday January 11th at 10:30 am. Ms Tiffany Osborn has been informed of the same. Arman Bogus RN BSN PCCn [phone]

## 2019-06-17 ENCOUNTER — Other Ambulatory Visit (HOSPITAL_COMMUNITY)
Admission: RE | Admit: 2019-06-17 | Discharge: 2019-06-17 | Disposition: A | Discharge status: Home / Self Care | Payer: Medicaid Other | Source: Ambulatory Visit | Attending: Women's Health | Admitting: Women's Health

## 2019-06-17 ENCOUNTER — Encounter: Payer: Self-pay | Admitting: Family Medicine

## 2019-06-17 ENCOUNTER — Ambulatory Visit (INDEPENDENT_AMBULATORY_CARE_PROVIDER_SITE_OTHER): Payer: Medicaid Other | Admitting: Family Medicine

## 2019-06-17 ENCOUNTER — Other Ambulatory Visit: Payer: Self-pay

## 2019-06-17 VITALS — BP 137/86 | HR 85 | Wt 171.8 lb

## 2019-06-17 DIAGNOSIS — Z0000 Encounter for general adult medical examination without abnormal findings: Secondary | ICD-10-CM

## 2019-06-17 DIAGNOSIS — Z01419 Encounter for gynecological examination (general) (routine) without abnormal findings: Secondary | ICD-10-CM

## 2019-06-17 NOTE — Patient Instructions (Signed)

## 2019-06-17 NOTE — Progress Notes (Signed)
GYNECOLOGY ANNUAL PREVENTATIVE CARE ENCOUNTER NOTE  Subjective:   Tiffany Osborn is a 34 y.o. [phone] female here for a annual gynecologic exam. Current complaints: none.   Denies abnormal vaginal bleeding, discharge, pelvic pain, problems with intercourse or other gynecologic concerns.    Gynecologic History Patient's last menstrual period was 06/16/2019 (exact date). Contraception: Nexplanon Last Pap: 2017. Results were: normal  Obstetric History OB History  Gravida Para Term Preterm AB Living  7 7 7  0 0 8  SAB TAB Ectopic Multiple Live Births  0 0 0 1 8    # Outcome Date GA Lbr Len/2nd Weight Sex Delivery Anes PTL Lv  7 Term 02/17/16 [redacted]w[redacted]d 04:42 / 00:51 6 lb 11.9 oz (3.06 kg) M Vag-Spont None  LIV  6A Term 2015    M Vag-Spont   LIV  6B Term 2015    M Vag-Spont   LIV  5 Term 2014    F Vag-Spont   LIV  4 Term 2012    F Vag-Spont   LIV  3 Term 2009    F Vag-Spont   LIV  2 Term 2007    M Vag-Spont   LIV  1 Term 2004    M Vag-Spont   LIV    Past Medical History:  Diagnosis Date  . Medical history non-contributory     Past Surgical History:  Procedure Laterality Date  . NO PAST SURGERIES      Current Outpatient Medications on File Prior to Visit  Medication Sig Dispense Refill  . ibuprofen (ADVIL,MOTRIN) 600 MG tablet Take 1 tablet (600 mg total) by mouth every 6 (six) hours as needed. 30 tablet 0  . Prenatal Vit-Fe Fumarate-FA (PRENATAL MULTIVITAMIN) TABS tablet Take 1 tablet by mouth daily at 12 noon.     No current facility-administered medications on file prior to visit.    No Known Allergies  Social History   Socioeconomic History  . Marital status: Married    Spouse name: Not on file  . Number of children: Not on file  . Years of education: Not on file  . Highest education level: Not on file  Occupational History  . Not on file  Tobacco Use  . Smoking status: Never Smoker  . Smokeless tobacco: Never Used  Substance and Sexual Activity  . Alcohol  use: No  . Drug use: No  . Sexual activity: Yes    Birth control/protection: None  Other Topics Concern  . Not on file  Social History Narrative  . Not on file   Social Determinants of Health   Financial Resource Strain:   . Difficulty of Paying Living Expenses: Not on file  Food Insecurity:   . Worried About 10-10-1970 in the Last Year: Not on file  . Ran Out of Food in the Last Year: Not on file  Transportation Needs:   . Lack of Transportation (Medical): Not on file  . Lack of Transportation (Non-Medical): Not on file  Physical Activity:   . Days of Exercise per Week: Not on file  . Minutes of Exercise per Session: Not on file  Stress:   . Feeling of Stress : Not on file  Social Connections:   . Frequency of Communication with Friends and Family: Not on file  . Frequency of Social Gatherings with Friends and Family: Not on file  . Attends Religious Services: Not on file  . Active Member of Clubs or Organizations: Not on file  . Attends  Club or Organization Meetings: Not on file  . Marital Status: Not on file  Intimate Partner Violence:   . Fear of Current or Ex-Partner: Not on file  . Emotionally Abused: Not on file  . Physically Abused: Not on file  . Sexually Abused: Not on file    No family history on file.  Diet: varied Exercise: none  The following portions of the patient's history were reviewed and updated as appropriate: allergies, current medications, past family history, past medical history, past social history, past surgical history and problem list.  Review of Systems Pertinent items noted in HPI and remainder of comprehensive ROS otherwise negative.   Objective:  BP 137/86   Pulse 85   Wt 171 lb 12.8 oz (77.9 kg)   LMP 06/16/2019 (Exact Date)   BMI 31.42 kg/m  CONSTITUTIONAL: Well-developed, well-nourished female in no acute distress.  HENT:  Normocephalic, atraumatic, External right and left ear normal. Oropharynx is clear and  moist EYES: Conjunctivae and EOM are normal. Pupils are equal, round, and reactive to light. No scleral icterus.  NECK: Normal range of motion, supple, no masses.  Normal thyroid.  SKIN: Skin is warm and dry. No rash noted. Not diaphoretic. No erythema. No pallor. NEUROLOGIC: Alert and oriented to person, place, and time. Normal reflexes, muscle tone coordination. No cranial nerve deficit noted. PSYCHIATRIC: Normal mood and affect. Normal behavior. Normal judgment and thought content. CARDIOVASCULAR: Normal heart rate noted, regular rhythm RESPIRATORY: Clear to auscultation bilaterally. Effort and breath sounds normal, no problems with respiration noted. BREASTS: Symmetric in size. Declines breast exam. ABDOMEN: Soft, normal bowel sounds, no distention noted.  No tenderness, rebound or guarding.  PELVIC: Normal appearing external genitalia; normal appearing vaginal mucosa and cervix.  No abnormal discharge noted.  Pap smear obtained.  Normal uterine size, no other palpable masses, no uterine or adnexal tenderness. MUSCULOSKELETAL: Normal range of motion. No tenderness.  No cyanosis, clubbing, or edema.  2+ distal pulses.  Exam done with chaperone present.  Assessment and Plan:   1. Well woman exam - Cytology - PAP( [place])  Will follow up results of pap smear and manage accordingly. Encouraged improvement in diet and exercise.  RTO in 2 weeks for Nexplanon insertion/removal  Routine preventative health maintenance measures emphasized. Please refer to After Visit Summary for other counseling recommendations.   Total face-to-face time with patient: 10 minutes. Over 50% of encounter was spent on counseling and coordination of care.   Merilyn Baba, DO OB Fellow, Faculty Practice 06/17/2019 11:18 AM

## 2019-06-18 LAB — CYTOLOGY - PAP
Adequacy: ABSENT
Comment: NEGATIVE
Diagnosis: NEGATIVE
High risk HPV: NEGATIVE

## 2019-07-01 ENCOUNTER — Other Ambulatory Visit: Payer: Self-pay

## 2019-07-01 ENCOUNTER — Encounter: Payer: Self-pay | Admitting: Family Medicine

## 2019-07-01 ENCOUNTER — Ambulatory Visit (INDEPENDENT_AMBULATORY_CARE_PROVIDER_SITE_OTHER): Payer: Medicaid Other | Admitting: Family Medicine

## 2019-07-01 VITALS — BP 136/87 | HR 78 | Wt 169.0 lb

## 2019-07-01 DIAGNOSIS — Z3046 Encounter for surveillance of implantable subdermal contraceptive: Secondary | ICD-10-CM

## 2019-07-01 DIAGNOSIS — Z30017 Encounter for initial prescription of implantable subdermal contraceptive: Secondary | ICD-10-CM

## 2019-07-01 LAB — POCT PREGNANCY, URINE: Preg Test, Ur: NEGATIVE

## 2019-07-01 MED ORDER — ETONOGESTREL 68 MG ~~LOC~~ IMPL
68.0000 mg | DRUG_IMPLANT | Freq: Once | SUBCUTANEOUS | Status: AC
  Administered 2019-07-01: 15:00:00 68 mg via SUBCUTANEOUS

## 2019-07-01 NOTE — Patient Instructions (Signed)
Nexplanon Instructions After Insertion  Keep bandage clean and dry for 24 hours  May use ice/Tylenol/Ibuprofen for soreness or pain  If you develop fever, drainage or increased warmth from incision site-contact office immediately   

## 2019-07-01 NOTE — Progress Notes (Signed)
Nexplanon inserted on 03/29/16

## 2019-07-01 NOTE — Progress Notes (Signed)
Patient was identified. Informed consent was signed, signed copy in chart. A time-out was performed.  Urine pregnancy test was negative.  Nexplanon Removal Procedure Note  Patient given informed consent for removal of her Nexplanon due to Nexplanon expiration. Time out was performed and signed copy scanning into the chart. Nexplanon site identified on left arm. Area prepped in usual sterile fashon. One 4 cc of 1% lidocaine was used to anesthetize the area at the distal end of the implant. A small stab incision was made w/ an 11 blade scalpel at the distal portion end. The Nexplanon rod was grasped using Adson pick ups and removed without difficulty. Removal of entire 40 mm rod confirmed and shown to patient. There was less than 3 cc blood loss. There were no complications. A new Nexplanon implant was inserted as below.    Nexplanon Insertion Procedure Note  The insertion site was identified 8-10 cm (3-4 inches) from the medial epicondyle of the humerus and 3-5 cm (1.25-2 inches) posterior to (below) the sulcus (groove) between the biceps and triceps muscles of the patient's right arm and marked. The site was already prepped and draped in the usual sterile fashion from the Nexplanon removal. Pt was prepped with alcohol swab and then injected with 4 cc of 1% lidocaine as per removal note above.  The site was already prepped with betadine. Nexplanon removed form packaging,  Device confirmed in needle, then inserted full length of needle and withdrawn per handbook instructions. Provider and patient verified presence of the implant in the woman's arm by palpation. Patient insertion site was covered with adhesive bandage and pressure bandage. There was minimal blood loss. Patient tolerated procedure well.  Patient was given post procedure instructions and Nexplanon user card with expiration date. Condoms were recommended for STI prevention. Patient was asked to keep the pressure dressing on for 24 hours to  minimize bruising and keep the adhesive bandage on for 3-5 days. The patient verbalized understanding of the plan of care and agrees.   Lot # [id number] Expiration Date: 08/27/2021  Marlowe Alt, DO OB Fellow, [facility] 07/01/2019 2:37 PM

## 2019-07-02 ENCOUNTER — Encounter: Payer: Self-pay | Admitting: General Practice

## 2020-06-26 ENCOUNTER — Telehealth: Payer: Self-pay

## 2020-07-14 NOTE — Telephone Encounter (Signed)
Chart opened by error.  Arman Bogus RN BSn PCCN  [facility] [phone]-cell [phone]-office

## 2021-04-07 ENCOUNTER — Encounter (HOSPITAL_COMMUNITY): Payer: Self-pay | Admitting: Emergency Medicine

## 2021-04-07 ENCOUNTER — Other Ambulatory Visit: Payer: Self-pay

## 2021-04-07 ENCOUNTER — Ambulatory Visit (HOSPITAL_COMMUNITY)
Admission: EM | Admit: 2021-04-07 | Discharge: 2021-04-07 | Disposition: A | Discharge status: Home / Self Care | Payer: Self-pay | Attending: Medical Oncology | Admitting: Medical Oncology

## 2021-04-07 DIAGNOSIS — J209 Acute bronchitis, unspecified: Secondary | ICD-10-CM

## 2021-04-07 DIAGNOSIS — Z789 Other specified health status: Secondary | ICD-10-CM

## 2021-04-07 DIAGNOSIS — R0981 Nasal congestion: Secondary | ICD-10-CM

## 2021-04-07 MED ORDER — ALBUTEROL SULFATE HFA 108 (90 BASE) MCG/ACT IN AERS: 1.0000 | INHALATION_SPRAY | Freq: Four times a day (QID) | RESPIRATORY_TRACT | 0 refills | Status: AC | PRN

## 2021-04-07 MED ORDER — BENZONATATE 100 MG PO CAPS: 100.0000 mg | ORAL_CAPSULE | Freq: Three times a day (TID) | ORAL | 0 refills | Status: AC

## 2021-04-07 MED ORDER — FLUTICASONE PROPIONATE 50 MCG/ACT NA SUSP: 2.0000 | Freq: Every day | NASAL | 0 refills | Status: AC

## 2021-04-07 MED ORDER — PREDNISONE 10 MG (21) PO TBPK: ORAL_TABLET | Freq: Every day | ORAL | 0 refills | Status: AC

## 2021-04-07 NOTE — ED Triage Notes (Signed)
Pt c/o cough, congestion and fever since Friday.

## 2021-04-07 NOTE — ED Provider Notes (Signed)
[facility]    CSN: [id number] Arrival date & time: 04/07/21  1125      History   Chief Complaint Chief Complaint  Patient presents with   Cough   Fever   Nasal Congestion    HPI Tiffany Osborn is a 35 y.o. female.   HPI  Cough: Patient presents with her daughter who she elects to have translate for her instead of an interpreter.  Patient reports that she has had symptoms of dry cough, fever and nasal congestion since Friday.  Fever is now low-grade in nature but she continues to have a cough which is her worst symptom.  She has not taken anything for symptoms.  Her daughter is sick with similar symptoms.  No chest pain, wheezing or shortness of breath.  Past Medical History:  Diagnosis Date   Medical history non-contributory     There are no problems to display for this patient.   Past Surgical History:  Procedure Laterality Date   NO PAST SURGERIES      OB History     Gravida  7   Para  7   Term  7   Preterm  0   AB  0   Living  8      SAB  0   IAB  0   Ectopic  0   Multiple  1   Live Births  8            Home Medications    Prior to Admission medications   Medication Sig Start Date End Date Taking? Authorizing Provider  ibuprofen (ADVIL,MOTRIN) 600 MG tablet Take 1 tablet (600 mg total) by mouth every 6 (six) hours as needed. 02/19/16   Arabella Merles, CNM  Prenatal Vit-Fe Fumarate-FA (PRENATAL MULTIVITAMIN) TABS tablet Take 1 tablet by mouth daily at 12 noon.    [provider]    Family History History reviewed. No pertinent family history.  Social History Social History   Tobacco Use   Smoking status: Never   Smokeless tobacco: Never  Substance Use Topics   Alcohol use: No   Drug use: No     Allergies   Patient has no known allergies.   Review of Systems Review of Systems  As stated above in HPI Physical Exam Triage Vital Signs ED Triage Vitals [04/07/21 1355]  Enc Vitals Group     BP (!)  107/58     Pulse Rate 90     Resp 18     Temp 99.8 F (37.7 C)     Temp Source Oral     SpO2 97 %     Weight      Height      Head Circumference      Peak Flow      Pain Score 0     Pain Loc      Pain Edu?      Excl. in GC?    No data found.  Updated Vital Signs BP (!) 107/58 (BP Location: Right Arm)   Pulse 90   Temp 99.8 F (37.7 C) (Oral)   Resp 18   SpO2 97%   Physical Exam Vitals and nursing note reviewed.  Constitutional:      General: She is not in acute distress.    Appearance: Normal appearance. She is not ill-appearing, toxic-appearing or diaphoretic.     Comments: Dry slightly wheeze like cough  HENT:     Right Ear: Tympanic membrane normal.  Left Ear: Tympanic membrane normal.     Nose: Congestion present.     Mouth/Throat:     Pharynx: Oropharynx is clear. No oropharyngeal exudate or posterior oropharyngeal erythema.  Eyes:     Extraocular Movements: Extraocular movements intact.     Pupils: Pupils are equal, round, and reactive to light.  Cardiovascular:     Rate and Rhythm: Normal rate and regular rhythm.     Pulses: Normal pulses.     Heart sounds: Normal heart sounds.  Pulmonary:     Effort: Pulmonary effort is normal.     Breath sounds: Normal breath sounds.     Comments: Lungs sound mildly irritated throughout Musculoskeletal:     Cervical back: Normal range of motion and neck supple.  Lymphadenopathy:     Cervical: No cervical adenopathy.  Skin:    General: Skin is warm.  Neurological:     Mental Status: She is alert and oriented to person, place, and time.     UC Treatments / Results  Labs (all labs ordered are listed, but only abnormal results are displayed) Labs Reviewed - No data to display  EKG   Radiology No results found.  Procedures Procedures (including critical care time)  Medications Ordered in UC Medications - No data to display  Initial Impression / Assessment and Plan / UC Course  I have reviewed the  triage vital signs and the nursing notes.  Pertinent labs & imaging results that were available during my care of the patient were reviewed by me and considered in my medical decision making (see chart for details).     New.  Appears to have acute bronchitis likely viral in nature along with continued nasal congestion.  Treating appropriately with Flonase, Tessalon, albuterol and prednisone to avoid further complication such as pneumonia.  LMP within 1 month.  Discussed how to use her medications along with red flag signs and symptoms. Final Clinical Impressions(s) / UC Diagnoses   Final diagnoses:  None   Discharge Instructions   None    ED Prescriptions   None    PDMP not reviewed this encounter.   Rushie Chestnut, [place] 04/07/21 1416

## 2023-01-23 NOTE — Congregational Nurse Program (Cosign Needed)
  Dept: [phone]   Congregational Nurse Program Note  Date of Encounter: 01/23/2023  Past Medical History: Past Medical History:  Diagnosis Date   Medical history non-contributory     Encounter Details:  CNP Questionnaire - 01/23/23 0836       Questionnaire   Ask client: Do you give verbal consent for me to treat you today? Yes    Student Assistance N/A    [facility] Patient Served  [facility]    Visit Setting with Client Phone/Text/Email    Patient Status Refugee    Insurance Medicaid    Insurance/Financial Assistance Referral N/A    Medication N/A    Medical Provider No    Screening Referrals Made N/A    Medical Referrals Made [facility]    Medical Appointment Made N/A    Recently w/o PCP, now 1st time PCP visit completed due to CNs referral or appointment made N/A    Food N/A    Transportation N/A    Housing/Utilities N/A    Interpersonal Safety N/A    Interventions Advocate/Support;Navigate Healthcare System;Case Management;Counsel;Educate    Abnormal to Normal Screening Since Last CN Visit N/A    Screenings CN Performed N/A    Sent Client to Lab for: N/A    Did client attend any of the following based off CNs referral or appointments made? N/A    ED Visit Averted Yes    Life-Saving Intervention Made N/A            Patient called requesting assistance with appointment for Nexplanon removal.  Patients states that it was inserted in 2021. She has nor been following up for medical check up. No PCP as well.   I have contacted [facility] med center and placed on waiting list. I have left a voice message at [facility] to be called back.  Nicole Cella Tammela Bales RN BSN PCCN  [facility] [phone]-cell [phone]-office

## 2023-01-29 NOTE — Congregational Nurse Program (Signed)
  Dept: [phone]   Congregational Nurse Program Note  Date of Encounter: 01/29/2023  Past Medical History: Past Medical History:  Diagnosis Date   Medical history non-contributory     Encounter Details:  CNP Questionnaire - 01/29/23 1611       Questionnaire   Ask client: Do you give verbal consent for me to treat you today? Yes    Student Assistance N/A    Location Patient Served  [facility]    Visit Setting with Client Phone/Text/Email    Patient Status Refugee    Insurance Medicaid    Insurance/Financial Assistance Referral N/A    Medication N/A    Medical Provider No    Screening Referrals Made N/A    Medical Referrals Made [facility]    Medical Appointment Made N/A    Recently w/o PCP, now 1st time PCP visit completed due to CNs referral or appointment made N/A    Food N/A    Transportation N/A    Housing/Utilities N/A    Interpersonal Safety N/A    Interventions Advocate/Support;Navigate Healthcare System;Case Management;Counsel;Educate    Abnormal to Normal Screening Since Last CN Visit N/A    Screenings CN Performed N/A    Sent Client to Lab for: N/A    Did client attend any of the following based off CNs referral or appointments made? N/A    ED Visit Averted Yes    Life-Saving Intervention Made N/A            Patient informed that appointment has been scheduled per request. Patient agrees to time and date of appointment. Patient has transportation. Address provided.  New Gynecological Visit with Warden Fillers Thursday April 06, 2023 Arrive by 9:00 AM EDT Starts at 9:15 AM EDT (20 minutes)  Add to calendar [facility] for [facility] at [facility] [address], [address] [place] [address] ?[phone]?   Nicole Cella Avalin Briley RN BSN PCCN  [facility] [phone]-cell [phone]-office

## 2023-04-06 ENCOUNTER — Encounter: Payer: Self-pay | Admitting: Obstetrics and Gynecology

## 2023-04-06 ENCOUNTER — Other Ambulatory Visit: Payer: Self-pay | Admitting: Obstetrics and Gynecology

## 2023-04-06 ENCOUNTER — Ambulatory Visit (INDEPENDENT_AMBULATORY_CARE_PROVIDER_SITE_OTHER): Payer: Medicaid Other | Admitting: Obstetrics and Gynecology

## 2023-04-06 ENCOUNTER — Other Ambulatory Visit (HOSPITAL_COMMUNITY)
Admission: RE | Admit: 2023-04-06 | Discharge: 2023-04-06 | Disposition: A | Discharge status: Home / Self Care | Payer: Medicaid Other | Source: Ambulatory Visit | Attending: Obstetrics and Gynecology | Admitting: Obstetrics and Gynecology

## 2023-04-06 VITALS — BP 137/88 | HR 78 | Ht 60.0 in | Wt 180.0 lb

## 2023-04-06 DIAGNOSIS — Z1339 Encounter for screening examination for other mental health and behavioral disorders: Secondary | ICD-10-CM | POA: Diagnosis not present

## 2023-04-06 DIAGNOSIS — N852 Hypertrophy of uterus: Secondary | ICD-10-CM

## 2023-04-06 DIAGNOSIS — Z01419 Encounter for gynecological examination (general) (routine) without abnormal findings: Secondary | ICD-10-CM

## 2023-04-06 DIAGNOSIS — Z113 Encounter for screening for infections with a predominantly sexual mode of transmission: Secondary | ICD-10-CM | POA: Diagnosis not present

## 2023-04-06 DIAGNOSIS — Z3046 Encounter for surveillance of implantable subdermal contraceptive: Secondary | ICD-10-CM | POA: Diagnosis not present

## 2023-04-06 DIAGNOSIS — Z1231 Encounter for screening mammogram for malignant neoplasm of breast: Secondary | ICD-10-CM

## 2023-04-06 NOTE — Progress Notes (Signed)
37 y.o. New GYN presents for AEX/PAP/STD Cultures and Nexplanon removal.

## 2023-04-06 NOTE — Progress Notes (Signed)
GYNECOLOGY ANNUAL PREVENTATIVE CARE ENCOUNTER NOTE  History:     Tiffany Osborn is a 37 y.o. [phone] female here for a routine annual gynecologic exam.  Current complaints: desires nexplanon removal.   Denies abnormal vaginal bleeding, discharge, pelvic pain, problems with intercourse or other gynecologic concerns.    Gynecologic History Patient's last menstrual period was 04/02/2023 (exact date). Contraception: Nexplanon Last Pap: 1/21. Results were: normal with negative HPV Last mammogram: n/a  Obstetric History OB History  Gravida Para Term Preterm AB Living  7 7 7  0 0 8  SAB IAB Ectopic Multiple Live Births  0 0 0 1 8    # Outcome Date GA Lbr Len/2nd Weight Sex Type Anes PTL Lv  7 Term 02/17/16 [redacted]w[redacted]d 04:42 / 00:51 6 lb 11.9 oz (3.06 kg) M Vag-Spont None  LIV  6A Term 2015    M Vag-Spont   LIV  6B Term 2015    M Vag-Spont   LIV  5 Term 2014    F Vag-Spont   LIV  4 Term 2012    F Vag-Spont   LIV  3 Term 2009    F Vag-Spont   LIV  2 Term 2007    M Vag-Spont   LIV  1 Term 2004    M Vag-Spont   LIV    Past Medical History:  Diagnosis Date   Medical history non-contributory     Past Surgical History:  Procedure Laterality Date   NO PAST SURGERIES      Current Outpatient Medications on File Prior to Visit  Medication Sig Dispense Refill   albuterol (VENTOLIN HFA) 108 (90 Base) MCG/ACT inhaler Inhale 1-2 puffs into the lungs every 6 (six) hours as needed for wheezing or shortness of breath. 1 each 0   benzonatate (TESSALON) 100 MG capsule Take 1 capsule (100 mg total) by mouth every 8 (eight) hours. 21 capsule 0   fluticasone (FLONASE) 50 MCG/ACT nasal spray Place 2 sprays into both nostrils daily. 16 mL 0   ibuprofen (ADVIL,MOTRIN) 600 MG tablet Take 1 tablet (600 mg total) by mouth every 6 (six) hours as needed. 30 tablet 0   predniSONE (STERAPRED UNI-PAK 21 TAB) 10 MG (21) TBPK tablet Take by mouth daily. Take 6 tabs by mouth daily  for 2 days, then 5 tabs for 2 days,  then 4 tabs for 2 days, then 3 tabs for 2 days, 2 tabs for 2 days, then 1 tab by mouth daily for 2 days 42 tablet 0   Prenatal Vit-Fe Fumarate-FA (PRENATAL MULTIVITAMIN) TABS tablet Take 1 tablet by mouth daily at 12 noon.     No current facility-administered medications on file prior to visit.    No Known Allergies  Social History:  reports that she has never smoked. She has never used smokeless tobacco. She reports that she does not drink alcohol and does not use drugs.  No family history on file.  The following portions of the patient's history were reviewed and updated as appropriate: allergies, current medications, past family history, past medical history, past social history, past surgical history and problem list.  Review of Systems Pertinent items noted in HPI and remainder of comprehensive ROS otherwise negative.  Physical Exam:  BP 137/88   Pulse 78   Ht 5' (1.524 m)   Wt 180 lb (81.6 kg)   LMP 04/02/2023 (Exact Date)   Breastfeeding No   BMI 35.15 kg/m  CONSTITUTIONAL: Well-developed, well-nourished female in no acute distress.  HENT:  Normocephalic, atraumatic, External right and left ear normal. Oropharynx is clear and moist EYES: Conjunctivae and EOM are normal.  NECK: Normal range of motion, supple, no masses.  Normal thyroid. No acanthosis nigricans SKIN: Skin is warm and dry. No rash noted. Not diaphoretic. No erythema. No pallor. MUSCULOSKELETAL: Normal range of motion. No tenderness.  No cyanosis, clubbing, or edema.  2+ distal pulses. NEUROLOGIC: Alert and oriented to person, place, and time. Normal reflexes, muscle tone coordination.  PSYCHIATRIC: Normal mood and affect. Normal behavior. Normal judgment and thought content. CARDIOVASCULAR: Normal heart rate noted, regular rhythm RESPIRATORY: Clear to auscultation bilaterally. Effort and breath sounds normal, no problems with respiration noted. BREASTS: Symmetric in size. No masses, tenderness, skin changes,  nipple drainage, or lymphadenopathy bilaterally. Performed in the presence of a chaperone. ABDOMEN: Soft, no distention noted.  No tenderness, rebound or guarding.  PELVIC: Normal appearing external genitalia and urethral meatus; normal appearing vaginal mucosa and cervix.  No abnormal discharge noted.  Pap smear obtained. Vaginal swab obtained. Slightly enlarged uterine size, no other palpable masses, no uterine or adnexal tenderness.  Performed in the presence of a chaperone.   Assessment and Plan:    1. Women's annual routine gynecological examination [Z01.419] Normal annual exam - Cytology - PAP( [place]) - Cervicovaginal ancillary only( [place])  2. Routine screening for STI (sexually transmitted infection) Per pt request - Cervicovaginal ancillary only( [place])  3. Nexplanon removal See separate note  4. Enlarged uterus Uterus slightly enlarged and due to mildly prolonged menses will check anatomy  5. Encounter for screening mammogram for breast cancer  - MM 3D SCREENING MAMMOGRAM BILATERAL BREAST; Future  Will follow up results of pap smear and manage accordingly. Mammogram scheduled Routine preventative health maintenance measures emphasized. Please refer to After Visit Summary for other counseling recommendations.      Mariel Aloe, MD, FACOG Obstetrician & Gynecologist, [facility], [facility]

## 2023-04-06 NOTE — Progress Notes (Signed)
     GYNECOLOGY OFFICE PROCEDURE NOTE  Tiffany Osborn is a 37 y.o. [id number] here for Nexplanon removal .  Last pap smear was on 1/21 and was normal.  No other gynecologic concerns.   [name] identified, informed consent performed, consent signed.   Appropriate time out taken. Nexplanon site identified.  Area prepped in usual sterile fashon. One ml of 1% lidocaine was used to anesthetize the area at the distal end of the implant. A small stab incision was made right beside the implant on the distal portion.  The Nexplanon rod was grasped using hemostats and removed without difficulty.  There was minimal blood loss. There were no complications.  3 ml of 1% lidocaine was injected around the incision for post-procedure analgesia.  Steri-strips were applied over the small incision.  A pressure bandage was applied to reduce any bruising.  The patient tolerated the procedure well and was given post procedure instructions.  Patient is planning to use nothing for contraception/attempt conception.   Mariel Aloe, MD, FACOG Obstetrician & Gynecologist, [facility], [facility]

## 2023-04-10 LAB — MOLECULAR ANCILLARY ONLY
Bacterial Vaginitis (gardnerella): POSITIVE — AB
Candida Glabrata: NEGATIVE
Candida Vaginitis: NEGATIVE
Chlamydia: NEGATIVE
Comment: NEGATIVE
Comment: NEGATIVE
Comment: NEGATIVE
Comment: NEGATIVE
Comment: NEGATIVE
Comment: NORMAL
Neisseria Gonorrhea: NEGATIVE
Trichomonas: NEGATIVE

## 2023-04-14 LAB — CYTOLOGY - PAP
Comment: NEGATIVE
Diagnosis: NEGATIVE
Diagnosis: REACTIVE
High risk HPV: NEGATIVE

## 2023-04-17 ENCOUNTER — Other Ambulatory Visit: Payer: Self-pay | Admitting: Emergency Medicine

## 2023-04-17 MED ORDER — METRONIDAZOLE 500 MG PO TABS: 500.0000 mg | ORAL_TABLET | Freq: Two times a day (BID) | ORAL | 0 refills | Status: AC

## 2023-04-17 NOTE — Progress Notes (Signed)
Pt informed of results, Rx.   

## 2023-04-20 ENCOUNTER — Ambulatory Visit (HOSPITAL_BASED_OUTPATIENT_CLINIC_OR_DEPARTMENT_OTHER)
Admission: RE | Admit: 2023-04-20 | Discharge: 2023-04-20 | Disposition: A | Discharge status: Home / Self Care | Payer: Medicaid Other | Source: Ambulatory Visit | Attending: Obstetrics and Gynecology | Admitting: Obstetrics and Gynecology

## 2023-04-20 DIAGNOSIS — N852 Hypertrophy of uterus: Secondary | ICD-10-CM | POA: Insufficient documentation

## 2023-05-23 ENCOUNTER — Ambulatory Visit: Payer: Medicaid Other | Admitting: Obstetrics and Gynecology
